# Patient Record
Sex: Female | Born: 1990 | Race: Black or African American | Hispanic: No | Marital: Single | Smoking: Never smoker
Health system: Southern US, Community
[De-identification: ages and names within clinical notes are randomized; demographics above are authoritative.]

## PROBLEM LIST (undated history)

## (undated) ENCOUNTER — Inpatient Hospital Stay (HOSPITAL_COMMUNITY): Payer: Self-pay

## (undated) DIAGNOSIS — Z789 Other specified health status: Secondary | ICD-10-CM

## (undated) DIAGNOSIS — D649 Anemia, unspecified: Secondary | ICD-10-CM

## (undated) HISTORY — DX: Anemia, unspecified: D64.9

## (undated) HISTORY — PX: NO PAST SURGERIES: SHX2092

---

## 2008-05-04 ENCOUNTER — Emergency Department (HOSPITAL_COMMUNITY): Admission: EM | Admit: 2008-05-04 | Discharge: 2008-05-04 | Payer: Self-pay | Admitting: Family Medicine

## 2010-02-11 ENCOUNTER — Ambulatory Visit (HOSPITAL_COMMUNITY): Admission: RE | Admit: 2010-02-11 | Discharge: 2010-02-11 | Payer: Self-pay | Admitting: Obstetrics

## 2010-07-11 ENCOUNTER — Inpatient Hospital Stay (HOSPITAL_COMMUNITY): Admission: AD | Admit: 2010-07-11 | Discharge: 2010-07-13 | Payer: Self-pay | Admitting: Obstetrics & Gynecology

## 2010-12-23 LAB — RPR: RPR Ser Ql: NONREACTIVE

## 2010-12-23 LAB — CBC
Hemoglobin: 12.3 g/dL (ref 12.0–15.0)
MCH: 32.8 pg (ref 26.0–34.0)
MCH: 33.3 pg (ref 26.0–34.0)
MCHC: 34.8 g/dL (ref 30.0–36.0)
MCV: 94.3 fL (ref 78.0–100.0)
RBC: 2.81 MIL/uL — ABNORMAL LOW (ref 3.87–5.11)
RBC: 3.75 MIL/uL — ABNORMAL LOW (ref 3.87–5.11)
RDW: 13.3 % (ref 11.5–15.5)
WBC: 10.6 10*3/uL — ABNORMAL HIGH (ref 4.0–10.5)
WBC: 6.9 10*3/uL (ref 4.0–10.5)

## 2011-11-25 ENCOUNTER — Inpatient Hospital Stay (HOSPITAL_COMMUNITY): Payer: Medicaid Other

## 2011-11-25 ENCOUNTER — Inpatient Hospital Stay (HOSPITAL_COMMUNITY)
Admission: AD | Admit: 2011-11-25 | Discharge: 2011-11-25 | Disposition: A | Discharge status: Home / Self Care | Payer: Medicaid Other | Source: Ambulatory Visit | Attending: Obstetrics & Gynecology | Admitting: Obstetrics & Gynecology

## 2011-11-25 ENCOUNTER — Encounter (HOSPITAL_COMMUNITY): Payer: Self-pay | Admitting: *Deleted

## 2011-11-25 DIAGNOSIS — O034 Incomplete spontaneous abortion without complication: Secondary | ICD-10-CM | POA: Insufficient documentation

## 2011-11-25 HISTORY — DX: Other specified health status: Z78.9

## 2011-11-25 LAB — CBC
HCT: 33.5 % — ABNORMAL LOW (ref 36.0–46.0)
Hemoglobin: 11.2 g/dL — ABNORMAL LOW (ref 12.0–15.0)
MCH: 30 pg (ref 26.0–34.0)
MCHC: 33.4 g/dL (ref 30.0–36.0)
MCV: 89.8 fL (ref 78.0–100.0)
Platelets: 336 10*3/uL (ref 150–400)
RBC: 3.73 MIL/uL — ABNORMAL LOW (ref 3.87–5.11)
RDW: 13 % (ref 11.5–15.5)

## 2011-11-25 LAB — URINALYSIS, ROUTINE W REFLEX MICROSCOPIC
Glucose, UA: NEGATIVE mg/dL
Urobilinogen, UA: 0.2 mg/dL (ref 0.0–1.0)
pH: 6 (ref 5.0–8.0)

## 2011-11-25 LAB — ABO/RH: ABO/RH(D): A POS

## 2011-11-25 LAB — URINE MICROSCOPIC-ADD ON

## 2011-11-25 MED ORDER — IBUPROFEN 600 MG PO TABS: 600.0000 mg | ORAL_TABLET | Freq: Four times a day (QID) | ORAL | Status: AC | PRN

## 2011-11-25 MED ORDER — ACETAMINOPHEN-CODEINE 300-30 MG PO TABS: 1.0000 | ORAL_TABLET | ORAL | Status: AC | PRN

## 2011-11-25 MED ORDER — MISOPROSTOL 200 MCG PO TABS: 800.0000 ug | ORAL_TABLET | Freq: Once | ORAL | Status: DC

## 2011-11-25 NOTE — ED Provider Notes (Signed)
History     Chief Complaint  Patient presents with  . Vaginal Bleeding   HPI  Pt is here with report of spotting of blood that started three days ago.  First started with cramping and spotting of blood.  Cramping has decreased, but spotting has continued.  Pain is in the midpelvic region.  Denies abnormal vaginal discharge or UTI symptoms.    Past Medical History  Diagnosis Date  . No pertinent past medical history     Past Surgical History  Procedure Date  . No past surgeries     Family History  Problem Relation Age of Onset  . Anesthesia problems Neg Hx     History  Substance Use Topics  . Smoking status: Never Smoker   . Smokeless tobacco: Never Used  . Alcohol Use: No    Allergies: No Known Allergies  Prescriptions prior to admission  Medication Sig Dispense Refill  . Prenatal Vit-Fe Fumarate-FA (PRENATAL MULTIVITAMIN) TABS Take 1 tablet by mouth daily.        Review of Systems  Gastrointestinal: Positive for abdominal pain.  Genitourinary:       Spotting of blood  All other systems reviewed and are negative.   Physical Exam   Blood pressure 108/51, pulse 72, temperature 98.2 F (36.8 C), temperature source Oral, resp. rate 18, height 5' 4.5" (1.638 m), last menstrual period 09/24/2011, SpO2 100.00%, unknown if currently breastfeeding.  Physical Exam  Constitutional: She is oriented to person, place, and time. She appears well-developed and well-nourished.  HENT:  Head: Normocephalic.  Neck: Normal range of motion. Neck supple.  Cardiovascular: Normal rate, regular rhythm and normal heart sounds.   Respiratory: Effort normal and breath sounds normal. No respiratory distress.  GI: Soft. She exhibits no mass. There is no tenderness. There is no rebound and no guarding.  Genitourinary: Uterus is enlarged. Cervix exhibits no motion tenderness. Right adnexum displays no mass and no tenderness. Left adnexum displays no mass and no tenderness. There is  bleeding around the vagina.  Neurological: She is alert and oriented to person, place, and time.  Skin: Skin is warm and dry.    MAU Course  Procedures   Results for orders placed during the hospital encounter of 11/25/11 (from the past 24 hour(s))  URINALYSIS, ROUTINE W REFLEX MICROSCOPIC     Status: Abnormal   Collection Time   11/25/11  2:05 PM      Component Value Range   Color, Urine YELLOW  YELLOW    APPearance CLEAR  CLEAR    Specific Gravity, Urine 1.020  1.005 - 1.030    pH 6.0  5.0 - 8.0    Glucose, UA NEGATIVE  NEGATIVE (mg/dL)   Hgb urine dipstick MODERATE (*) NEGATIVE    Bilirubin Urine NEGATIVE  NEGATIVE    Ketones, ur NEGATIVE  NEGATIVE (mg/dL)   Protein, ur NEGATIVE  NEGATIVE (mg/dL)   Urobilinogen, UA 0.2  0.0 - 1.0 (mg/dL)   Nitrite NEGATIVE  NEGATIVE    Leukocytes, UA NEGATIVE  NEGATIVE   URINE MICROSCOPIC-ADD ON     Status: Abnormal   Collection Time   11/25/11  2:05 PM      Component Value Range   Squamous Epithelial / LPF FEW (*) RARE    WBC, UA 0-2  <3 (WBC/hpf)   RBC / HPF 0-2  <3 (RBC/hpf)   Bacteria, UA FEW (*) RARE    Urine-Other MUCOUS PRESENT    POCT PREGNANCY, URINE  Status: Abnormal   Collection Time   11/25/11  2:48 PM      Component Value Range   Preg Test, Ur POSITIVE (*) NEGATIVE   WET PREP, GENITAL     Status: Abnormal   Collection Time   11/25/11  3:00 PM      Component Value Range   Yeast Wet Prep HPF POC NONE SEEN  NONE SEEN    Trich, Wet Prep NONE SEEN  NONE SEEN    Clue Cells Wet Prep HPF POC NONE SEEN  NONE SEEN    WBC, Wet Prep HPF POC FEW (*) NONE SEEN   CBC     Status: Abnormal   Collection Time   11/25/11  3:24 PM      Component Value Range   WBC 6.5  4.0 - 10.5 (K/uL)   RBC 3.73 (*) 3.87 - 5.11 (MIL/uL)   Hemoglobin 11.2 (*) 12.0 - 15.0 (g/dL)   HCT 16.1 (*) 09.6 - 46.0 (%)   MCV 89.8  78.0 - 100.0 (fL)   MCH 30.0  26.0 - 34.0 (pg)   MCHC 33.4  30.0 - 36.0 (g/dL)   RDW 04.5  40.9 - 81.1 (%)   Platelets 336  150 -  400 (K/uL)  HCG, QUANTITATIVE, PREGNANCY     Status: Abnormal   Collection Time   11/25/11  3:24 PM      Component Value Range   hCG, Beta Chain, Quant, S 3500 (*) <5 (mIU/mL)  ABO/RH     Status: Normal   Collection Time   11/25/11  3:24 PM      Component Value Range   ABO/RH(D) A POS     Reviewed Cytotec Protocol; pt meets all criteria and consents to procedure.  Assessment and Plan  Fetal Demise  Plan: RX cytotec Tylenol#3 Ibuprofen F/U in GYN clinic for BHCG only in two weeks  [facility] 11/25/2011, 2:53 PM

## 2011-11-25 NOTE — Discharge Instructions (Signed)
Incomplete Miscarriage Miscarriages in pregnancy are common. A miscarriage is a pregnancy that has ended before the twentieth week. You have had an incomplete miscarriage. Partial parts of the fetus or placenta (afterbirth) remain behind. Sometimes further treatment is needed. The most common reason for further treatment is continued bleeding (hemorrhage). Tissue left behind may also become infected. Treatment usually is curettage. Curettage for an incomplete abortion is a procedure in which the remaining products of pregnancy are removed. This can be done by a simple sucking procedure (suction curettage). It can also be done by a simple scraping (curettage) of the inside of the uterus (womb). This may be done in the hospital or in the caregiver's office. This is only done when your caregiver knows the pregnancy has ended. This is determined by physical examination and a negative pregnancy test. It may also include an ultrasound to confirm a dead fetus. The ultrasound may also prove that products of the pregnancy remain in the uterus. If your cervix remains dilated and you are still passing clots and tissue, your caregiver may wish to watch you for a little while. Your caregiver may want to see if you are going to finish passing all of the remaining parts of the pregnancy. If the bleeding continues, they may proceed with curettage. WHY DO I FEEL THIS WAY Miscarriages can be a very emotional time for prospective mothers. This is not you or your partner's fault. The miscarriage did not occur because of a lack in you or your partner. Nearly all miscarriages occur because the pregnancy has started off wrongly. At least half of miscarried pregnancies have a chromosomal abnormality (almost always not inherited). Others may have developmental problems with the fetus or placentas. Problems may not show up even when the products miscarried are studied under the microscope. You can usually begin trying for another  pregnancy as soon as your caregiver says it's okay. HOME CARE INSTRUCTIONS   Your caregiver may order bed rest (this means only getting up to use the bathroom). Your caregiver may allow you to continue light activity. If curettage was not done at this time, but you require further treatment.   Keep track of the number of pads you use each day. Keep track of how saturated (soaked) they are. Record this information.   Do not use tampons. Do not douche or have sexual intercourse until approved by your caregiver.   It is very important to keep all follow-up appointments for re-evaluation and continuing management.   Women who have an Rh negative blood type (ie, A, B, AB, or O negative) need to receive a drug called Rh(D) immune globulin. This medicine helps protect future fetuses against problems that can occur if an Rh negative mother is carrying a baby who is Rh positive.  SEEK IMMEDIATE MEDICAL CARE IF:   You experience severe cramps in your stomach, back, or abdomen.   You run an unexplained temperature (record these).   You pass large clots or tissue (save any tissue for your caregiver to inspect).   Your bleeding increases or you become light-headed, weak, or have fainting episodes.  MAKE SURE YOU:   Understand these instructions.   Will watch your condition.   Will get help right away if you are not doing well or get worse.  Document Released: 09/26/2005 Document Revised: 06/08/2011 Document Reviewed: 05/16/2008 Exit[employer] Patient Information 2012 [employer]. 

## 2011-11-25 NOTE — Progress Notes (Addendum)
+  preg test about a month ago.  occ stomach discomfort and dk red/brown d/c, started 2 days ago.  Low back and low stomach pain started yesterday.  LMP some time in Dec.

## 2011-11-26 LAB — GC/CHLAMYDIA PROBE AMP, GENITAL
Chlamydia, DNA Probe: NEGATIVE
GC Probe Amp, Genital: NEGATIVE

## 2011-11-27 NOTE — ED Provider Notes (Signed)
Medical Screening exam and patient care preformed by advanced practice provider.  Agree with the above management.  

## 2012-08-08 ENCOUNTER — Emergency Department (INDEPENDENT_AMBULATORY_CARE_PROVIDER_SITE_OTHER)
Admission: EM | Admit: 2012-08-08 | Discharge: 2012-08-08 | Disposition: A | Discharge status: Home / Self Care | Payer: Medicaid Other | Source: Home / Self Care | Attending: Family Medicine | Admitting: Family Medicine

## 2012-08-08 ENCOUNTER — Encounter (HOSPITAL_COMMUNITY): Payer: Self-pay | Admitting: *Deleted

## 2012-08-08 DIAGNOSIS — L309 Dermatitis, unspecified: Secondary | ICD-10-CM

## 2012-08-08 DIAGNOSIS — L259 Unspecified contact dermatitis, unspecified cause: Secondary | ICD-10-CM

## 2012-08-08 MED ORDER — TRIAMCINOLONE ACETONIDE 0.5 % EX OINT: TOPICAL_OINTMENT | Freq: Two times a day (BID) | CUTANEOUS | Status: DC

## 2012-08-08 MED ORDER — HYDROXYZINE HCL 25 MG PO TABS: 25.0000 mg | ORAL_TABLET | Freq: Three times a day (TID) | ORAL | Status: DC | PRN

## 2012-08-08 MED ORDER — CEPHALEXIN 500 MG PO CAPS: 500.0000 mg | ORAL_CAPSULE | Freq: Two times a day (BID) | ORAL | Status: DC

## 2012-08-08 NOTE — ED Notes (Signed)
Pt  Reports   She    Has     A     Dry  scaley   Area     To  r      Ring  Finger  Which  She      Has   Had   Had    For  About  3 months        The  Pt    Reports       The  Symptoms  Are  Getting  Worse              She  Reports  She  May  Have   Had  clorox   On the  Finger   Initially          She  Is  Sitting  Upright on  Exam table speaking in  Complete  sentances  And  Appears  In no  Acute  Distress

## 2012-08-11 NOTE — ED Provider Notes (Signed)
History     CSN: [id number]  Arrival date & time 08/08/12  1153   First MD Initiated Contact with Patient 08/08/12 1240      Chief Complaint  Patient presents with  . Hand Problem    (Consider location/radiation/quality/duration/timing/severity/associated sxs/prior treatment) HPI Comments: 21 y/o right handed female here c/o of dry itch skin in right middle finger for 3 months getting worse now with brakes, swelling and burning. States her symptoms started after direct exposure to Clorox in affected area. No other body areas affected. No fever or chills. No wheezing or h/o asthma.    Past Medical History  Diagnosis Date  . No pertinent past medical history     Past Surgical History  Procedure Date  . No past surgeries     Family History  Problem Relation Age of Onset  . Anesthesia problems Neg Hx     History  Substance Use Topics  . Smoking status: Never Smoker   . Smokeless tobacco: Never Used  . Alcohol Use: No    OB History    Grav Para Term Preterm Abortions TAB SAB Ect Mult Living   2 1 1       1       Review of Systems  Constitutional: Negative for fever and chills.  HENT: Negative for sneezing.   Respiratory: Negative for wheezing.   Musculoskeletal: Negative for joint swelling and arthralgias.  Skin:       As per HPI  All other systems reviewed and are negative.    Allergies  Review of patient's allergies indicates no known allergies.  Home Medications   Current Outpatient Rx  Name Route Sig Dispense Refill  . CEPHALEXIN 500 MG PO CAPS Oral Take 1 capsule (500 mg total) by mouth 2 (two) times daily. 14 capsule 0  . HYDROXYZINE HCL 25 MG PO TABS Oral Take 1 tablet (25 mg total) by mouth every 8 (eight) hours as needed for itching. 12 tablet 0  . MISOPROSTOL 200 MCG PO TABS Oral Take 4 tablets (800 mcg total) by mouth once. Insert in vagina at once; do not take orally. 4 tablet 0  . PRENATAL MULTIVITAMIN CH Oral Take 1 tablet by mouth daily.      . TRIAMCINOLONE ACETONIDE 0.5 % EX OINT Topical Apply topically 2 (two) times daily. 30 g 0    BP 98/61  Pulse 62  Temp 99.3 F (37.4 C) (Oral)  Resp 16  SpO2 100%  LMP 07/12/2012  Breastfeeding? Unknown  Physical Exam  Nursing note and vitals reviewed. Constitutional: She is oriented to person, place, and time.  Cardiovascular: Normal heart sounds.   Pulmonary/Chest: Breath sounds normal.  Neurological: She is alert and oriented to person, place, and time.  Skin:       Right hand: skin thickening hyperpigmentation and peeling of right middle finger mostly dorsal but also affecting palmar side. Few skin brakes with minimal amber drainage and crusts. Area around and nail appears speared. Patient has otherwise full range of motion and superficial sensation in the affected finger.   Rest of hand exam is normal. Rest of body skin is clear.     ED Course  Procedures (including critical care time)  Labs Reviewed - No data to display No results found.   1. Dermatitis       MDM  Eczema vs lichen planus. Appears over infected. Prescribed keflex, hydroxyzine and reccommended occlusive bandage and treatment with topical kenalog 0.5% ointment. Follow up with dermatology as  needed.       Sharin Grave, MD 08/11/12 1043

## 2014-08-11 ENCOUNTER — Encounter (HOSPITAL_COMMUNITY): Payer: Self-pay | Admitting: *Deleted

## 2014-10-10 NOTE — L&D Delivery Note (Cosign Needed)
Delivery Note  This is a 24 year old G 3 P1 who was admitted for Not in labor. and IOL for post dates. She progressed normally with cytotec, pitocin and 1 dose of IVP Fentanyl to the second stage of labor.  She pushed for 10 min. A loose nuchal cord  was identified.  At 8:02 PM a viable female was delivered via Vaginal, Spontaneous Delivery (Presentation: ; Occiput Anterior).  APGAR: 8, 9; weight  .  Infant was placed on maternal abdomen.  Delayed cord clamping for several minutes.  Cord double clamped and cut.  Placenta status: Intact, Spontaneous, shultz,with trailing membranes and multiple placental cysts.  Cord: 3 vessels with the following complications: None.  Cord pH: N/A.  Inspection revealed 1st degree and vaginal. The uterus was firm bleeding stable.  The repair was done under local lidocaine.   EBL was 420.    There were no complications during the procedure.  Mom and baby skin to skin following delivery. Left in stable condition.  Anesthesia: None  Episiotomy: None Lacerations: 1st degree Suture Repair: 3.0 vicryl Est. Blood Loss (mL):  48  Mom to postpartum.  Baby to Couplet care / Skin to Skin.  Kemar Pandit A Zinedine Ellner 06/16/2015, 8:40 PM

## 2014-11-10 ENCOUNTER — Telehealth: Payer: Self-pay | Admitting: *Deleted

## 2014-11-10 NOTE — Telephone Encounter (Signed)
Patient interested in scheduling a NOB appointment. LMP 09-03-14. Patient verfied pregnancy at [facility].  Attempted to contact patient and left message for patient to contact the office.

## 2014-11-13 NOTE — Telephone Encounter (Signed)
Patient is scheduled for a NOB appointment on 11-14-14 @ 2 pm with Kandis Cocking, CNM.

## 2014-11-14 ENCOUNTER — Encounter: Payer: Self-pay | Admitting: Certified Nurse Midwife

## 2014-11-14 ENCOUNTER — Other Ambulatory Visit: Payer: Self-pay | Admitting: Certified Nurse Midwife

## 2014-11-14 ENCOUNTER — Ambulatory Visit (INDEPENDENT_AMBULATORY_CARE_PROVIDER_SITE_OTHER): Payer: Medicaid Other | Admitting: Certified Nurse Midwife

## 2014-11-14 VITALS — BP 125/81 | HR 89 | Temp 98.6°F | Wt 121.0 lb

## 2014-11-14 DIAGNOSIS — Z3481 Encounter for supervision of other normal pregnancy, first trimester: Secondary | ICD-10-CM

## 2014-11-14 DIAGNOSIS — Z3689 Encounter for other specified antenatal screening: Secondary | ICD-10-CM

## 2014-11-14 LAB — POCT URINALYSIS DIPSTICK
BILIRUBIN UA: NEGATIVE
Glucose, UA: NEGATIVE
Ketones, UA: NEGATIVE
Nitrite, UA: NEGATIVE
PROTEIN UA: NEGATIVE
RBC UA: NEGATIVE
SPEC GRAV UA: 1.01
Urobilinogen, UA: NEGATIVE
pH, UA: 7

## 2014-11-14 LAB — OB RESULTS CONSOLE GC/CHLAMYDIA
Chlamydia: NEGATIVE
Gonorrhea: NEGATIVE

## 2014-11-14 LAB — HIV ANTIBODY (ROUTINE TESTING W REFLEX): HIV 1&2 Ab, 4th Generation: NONREACTIVE

## 2014-11-14 NOTE — Progress Notes (Signed)
Subjective:    Erin Torres is being seen today for her first obstetrical visit.  This is not a planned pregnancy. She is at [redacted]w[redacted]d gestation. Her obstetrical history is non-significant for complications, had a 26 yr old son vaginally no complications that was breastfed. Relationship with FOB: significant other, not living together. Patient does intend to breast feed. Pregnancy history fully reviewed.  The information documented in the HPI was reviewed and verified.  Menstrual History: OB History    Gravida Para Term Preterm AB TAB SAB Ectopic Multiple Living   3 1 1  1  1   1       Menarche age: 28  Patient's last menstrual period was 08/31/2014 (lmp unknown).    Past Medical History  Diagnosis Date  . No pertinent past medical history     Past Surgical History  Procedure Laterality Date  . No past surgeries       (Not in a hospital admission) No Known Allergies  History  Substance Use Topics  . Smoking status: Never Smoker   . Smokeless tobacco: Never Used  . Alcohol Use: No    Family History  Problem Relation Age of Onset  . Anesthesia problems Neg Hx      Review of Systems Constitutional: negative for weight loss Gastrointestinal: negative for vomiting Genitourinary:negative for genital lesions and vaginal discharge and dysuria Musculoskeletal:negative for back pain Behavioral/Psych: negative for abusive relationship, depression, illegal drug usage and tobacco use    Objective:    BP 125/81 mmHg  Pulse 89  Temp(Src) 98.6 F (37 C)  Wt 54.885 kg (121 lb)  LMP 08/31/2014 (LMP Unknown) General Appearance:    Alert, cooperative, no distress, appears stated age  Head:    Normocephalic, without obvious abnormality, atraumatic  Eyes:    PERRL, conjunctiva/corneas clear, EOM's intact, fundi    benign, both eyes  Ears:    Normal TM's and external ear canals, both ears  Nose:   Nares normal, septum midline, mucosa normal, no drainage    or sinus tenderness   Throat:   Lips, mucosa, and tongue normal; teeth and gums normal  Neck:   Supple, symmetrical, trachea midline, no adenopathy;    thyroid:  no enlargement/tenderness/nodules; no carotid   bruit or JVD  Back:     Symmetric, no curvature, ROM normal, no CVA tenderness  Lungs:     Clear to auscultation bilaterally, respirations unlabored  Chest Wall:    No tenderness or deformity   Heart:    Regular rate and rhythm, S1 and S2 normal, no murmur, rub   or gallop  Breast Exam:    No tenderness, masses, or nipple abnormality  Abdomen:     Soft, non-tender, bowel sounds active all four quadrants,    no masses, no organomegaly  Genitalia:    Normal female without lesion, discharge or tenderness  Extremities:   Extremities normal, atraumatic, no cyanosis or edema  Pulses:   2+ and symmetric all extremities  Skin:   Skin color, texture, turgor normal, no rashes or lesions  Lymph nodes:   Cervical, supraclavicular, and axillary nodes normal  Neurologic:   CNII-XII intact, normal strength, sensation and reflexes    throughout      Lab Review Urine pregnancy test Labs reviewed yes Radiologic studies reviewed no Assessment:    Pregnancy at [redacted]w[redacted]d weeks    Plan:      Prenatal vitamins.  Counseling provided regarding continued use of seat belts, cessation of alcohol consumption,  smoking or use of illicit drugs; infection precautions i.e., influenza/TDAP immunizations, toxoplasmosis,CMV, parvovirus, listeria and varicella; workplace safety, exercise during pregnancy; routine dental care, safe medications, sexual activity, hot tubs, saunas, pools, travel, caffeine use, fish and methlymercury, potential toxins, hair treatments, varicose veins.  Counseling on ways to manage nausea given.   Weight gain recommendations per IOM guidelines reviewed: underweight/BMI< 18.5--> gain 28 - 40 lbs; normal weight/BMI 18.5 - 24.9--> gain 25 - 35 lbs; overweight/BMI 25 - 29.9--> gain 15 - 25 lbs; obese/BMI >30->gain   11 - 20 lbs Problem list reviewed and updated. FIRST/CF mutation testing/NIPT/QUAD SCREEN/fragile X/Ashkenazi Jewish population testing/Spinal muscular atrophy discussed: declined. Role of ultrasound in pregnancy discussed; fetal survey: requested. Amniocentesis discussed: not indicated.  No orders of the defined types were placed in this encounter.   Orders Placed This Encounter  Procedures  . Culture, OB Urine  . SureSwab, Vaginosis/Vaginitis Plus  . US OB Transvaginal    Standing Status: Future     Number of Occurrences:      Standing Expiration Date: 01/13/2016    Order Specific Question:  Reason for Exam (SYMPTOM  OR DIAGNOSIS REQUIRED)    Answer:  Dating    Order Specific Question:  Preferred imaging location?    Answer:  Internal  . Obstetric panel  . HIV antibody  . Hemoglobinopathy evaluation  . Varicella zoster antibody, IgG  . Vit D  25 hydroxy (rtn osteoporosis monitoring)  . POCT urinalysis dipstick    Follow up in 4 weeks.

## 2014-11-15 LAB — VITAMIN D 25 HYDROXY (VIT D DEFICIENCY, FRACTURES): Vit D, 25-Hydroxy: 9 ng/mL — ABNORMAL LOW (ref 30–100)

## 2014-11-16 LAB — CULTURE, OB URINE
COLONY COUNT: NO GROWTH
ORGANISM ID, BACTERIA: NO GROWTH

## 2014-11-17 LAB — OBSTETRIC PANEL
ANTIBODY SCREEN: NEGATIVE
BASOS ABS: 0 10*3/uL (ref 0.0–0.1)
BASOS PCT: 0 % (ref 0–1)
Eosinophils Absolute: 0.1 10*3/uL (ref 0.0–0.7)
Eosinophils Relative: 1 % (ref 0–5)
HEMATOCRIT: 32.4 % — AB (ref 36.0–46.0)
HEP B S AG: NEGATIVE
Hemoglobin: 10.9 g/dL — ABNORMAL LOW (ref 12.0–15.0)
LYMPHS PCT: 32 % (ref 12–46)
Lymphs Abs: 2.7 10*3/uL (ref 0.7–4.0)
MCH: 30.3 pg (ref 26.0–34.0)
MCHC: 33.6 g/dL (ref 30.0–36.0)
MCV: 90 fL (ref 78.0–100.0)
MONO ABS: 0.7 10*3/uL (ref 0.1–1.0)
MONOS PCT: 9 % (ref 3–12)
MPV: 9.2 fL (ref 8.6–12.4)
NEUTROS ABS: 4.8 10*3/uL (ref 1.7–7.7)
NEUTROS PCT: 58 % (ref 43–77)
PLATELETS: 420 10*3/uL — AB (ref 150–400)
RBC: 3.6 MIL/uL — AB (ref 3.87–5.11)
RDW: 13.1 % (ref 11.5–15.5)
RUBELLA: 1.8 {index} — AB (ref <0.90)
Rh Type: POSITIVE
WBC: 8.3 10*3/uL (ref 4.0–10.5)

## 2014-11-17 LAB — VARICELLA ZOSTER ANTIBODY, IGG: Varicella IgG: 274.3 Index — ABNORMAL HIGH (ref <135.00)

## 2014-11-18 LAB — SURESWAB, VAGINOSIS/VAGINITIS PLUS
Atopobium vaginae: NOT DETECTED Log (cells/mL)
C. GLABRATA, DNA: NOT DETECTED
C. PARAPSILOSIS, DNA: NOT DETECTED
C. TROPICALIS, DNA: NOT DETECTED
C. albicans, DNA: DETECTED — AB
C. trachomatis RNA, TMA: NOT DETECTED
Gardnerella vaginalis: NOT DETECTED Log (cells/mL)
LACTOBACILLUS SPECIES: NOT DETECTED Log (cells/mL)
MEGASPHAERA SPECIES: NOT DETECTED Log (cells/mL)
N. GONORRHOEAE RNA, TMA: NOT DETECTED
T. vaginalis RNA, QL TMA: NOT DETECTED

## 2014-11-18 LAB — HEMOGLOBINOPATHY EVALUATION
HEMOGLOBIN OTHER: 0 %
HGB A2 QUANT: 2.3 % (ref 2.2–3.2)
Hgb A: 97.7 % (ref 96.8–97.8)
Hgb F Quant: 0 % (ref 0.0–2.0)
Hgb S Quant: 0 %

## 2014-11-19 ENCOUNTER — Ambulatory Visit (INDEPENDENT_AMBULATORY_CARE_PROVIDER_SITE_OTHER): Payer: Medicaid Other

## 2014-11-19 ENCOUNTER — Other Ambulatory Visit: Payer: Self-pay | Admitting: Certified Nurse Midwife

## 2014-11-19 DIAGNOSIS — Z3689 Encounter for other specified antenatal screening: Secondary | ICD-10-CM

## 2014-11-19 DIAGNOSIS — Z36 Encounter for antenatal screening of mother: Secondary | ICD-10-CM

## 2014-11-19 LAB — US OB COMP LESS 14 WKS

## 2014-11-26 ENCOUNTER — Encounter: Payer: Self-pay | Admitting: *Deleted

## 2014-11-26 ENCOUNTER — Other Ambulatory Visit: Payer: Self-pay | Admitting: *Deleted

## 2014-11-26 DIAGNOSIS — Z3492 Encounter for supervision of normal pregnancy, unspecified, second trimester: Secondary | ICD-10-CM

## 2014-11-26 DIAGNOSIS — B379 Candidiasis, unspecified: Secondary | ICD-10-CM

## 2014-11-26 MED ORDER — TERCONAZOLE 0.4 % VA CREA: 1.0000 | TOPICAL_CREAM | Freq: Every day | VAGINAL | Status: DC

## 2014-11-26 MED ORDER — OB COMPLETE PETITE 35-5-1-200 MG PO CAPS: 1.0000 | ORAL_CAPSULE | Freq: Every day | ORAL | Status: DC

## 2014-11-26 NOTE — Progress Notes (Signed)
PNV sent to pharmacy for decreased vitamin D.  Letter to be sent to pt.

## 2014-11-26 NOTE — Progress Notes (Signed)
terazol 7 sent to pharmacy for yeast inf.

## 2014-12-04 ENCOUNTER — Encounter: Payer: Self-pay | Admitting: Obstetrics

## 2014-12-11 ENCOUNTER — Encounter: Payer: Self-pay | Admitting: Obstetrics

## 2014-12-12 ENCOUNTER — Encounter: Payer: Medicaid Other | Admitting: Certified Nurse Midwife

## 2014-12-17 ENCOUNTER — Ambulatory Visit (INDEPENDENT_AMBULATORY_CARE_PROVIDER_SITE_OTHER): Payer: Medicaid Other | Admitting: Certified Nurse Midwife

## 2014-12-17 ENCOUNTER — Encounter: Payer: Medicaid Other | Admitting: Certified Nurse Midwife

## 2014-12-17 VITALS — BP 110/63 | HR 90 | Wt 121.0 lb

## 2014-12-17 DIAGNOSIS — Z3482 Encounter for supervision of other normal pregnancy, second trimester: Secondary | ICD-10-CM

## 2014-12-17 DIAGNOSIS — Z8669 Personal history of other diseases of the nervous system and sense organs: Secondary | ICD-10-CM | POA: Insufficient documentation

## 2014-12-17 DIAGNOSIS — O99012 Anemia complicating pregnancy, second trimester: Secondary | ICD-10-CM

## 2014-12-17 LAB — POCT URINALYSIS DIPSTICK
Bilirubin, UA: NEGATIVE
Glucose, UA: NEGATIVE
Ketones, UA: NEGATIVE
Leukocytes, UA: NEGATIVE
NITRITE UA: NEGATIVE
PH UA: 6.5
Protein, UA: NEGATIVE
RBC UA: NEGATIVE
SPEC GRAV UA: 1.015
Urobilinogen, UA: NEGATIVE

## 2014-12-17 LAB — HEMOGLOBIN AND HEMATOCRIT, BLOOD
HCT: 31.3 % — ABNORMAL LOW (ref 36.0–46.0)
HEMOGLOBIN: 10.7 g/dL — AB (ref 12.0–15.0)

## 2014-12-17 MED ORDER — BUTALBITAL-APAP-CAFFEINE 50-325-40 MG PO TABS: 1.0000 | ORAL_TABLET | Freq: Four times a day (QID) | ORAL | Status: AC | PRN

## 2014-12-17 NOTE — Progress Notes (Signed)
  Subjective:    Erin Torres is a 24 y.o. female being seen today for her obstetrical visit. She is at [redacted]w[redacted]d gestation. Patient reports: headache and no contractions, no bleeding or leaking of fluid.  HA somewhat relieved with OTC tylenol.  Reports low back ache, non-radiating, denies pelvic pressure.    Problem List Items Addressed This Visit    None    Visit Diagnoses    Encounter for supervision of other normal pregnancy in second trimester    -  Primary    Relevant Orders    POCT urinalysis dipstick    US OB Comp + 14 Wk    Anemia affecting pregnancy in second trimester, antepartum        Relevant Orders    Hemoglobin and hematocrit, blood    Hx of migraines          There are no active problems to display for this patient.   Objective:     BP 110/63 mmHg  Pulse 90  Wt 54.885 kg (121 lb)  LMP 08/31/2014 (LMP Unknown) Uterine Size: Below umbilicus     Assessment:    Pregnancy @ [redacted]w[redacted]d  weeks Doing well Migraines    Plan:    Problem list reviewed and updated. Labs reviewed. F/U H&H drawn today.  Fioricet given for Migraines.  Follow up in 4 weeks after fetal anatomy scan. FIRST/CF mutation testing/NIPT/QUAD SCREEN/fragile X/Ashkenazi Jewish population testing/Spinal muscular atrophy discussed: declined. Role of ultrasound in pregnancy discussed; fetal survey: requested. Amniocentesis discussed: not indicated.

## 2014-12-30 ENCOUNTER — Other Ambulatory Visit: Payer: Self-pay | Admitting: Certified Nurse Midwife

## 2014-12-30 DIAGNOSIS — Z1389 Encounter for screening for other disorder: Secondary | ICD-10-CM

## 2014-12-31 ENCOUNTER — Telehealth: Payer: Self-pay

## 2014-12-31 NOTE — Telephone Encounter (Signed)
changed patient's u/s to 4/6 as she will be further along closer to 20 weeks as is anatomy scan - ok per patient and Pam

## 2015-01-07 ENCOUNTER — Other Ambulatory Visit: Payer: Medicaid Other

## 2015-01-14 ENCOUNTER — Ambulatory Visit (INDEPENDENT_AMBULATORY_CARE_PROVIDER_SITE_OTHER): Payer: Medicaid Other

## 2015-01-14 ENCOUNTER — Ambulatory Visit (INDEPENDENT_AMBULATORY_CARE_PROVIDER_SITE_OTHER): Payer: Medicaid Other | Admitting: Certified Nurse Midwife

## 2015-01-14 VITALS — BP 106/58 | HR 87 | Temp 98.2°F | Wt 124.0 lb

## 2015-01-14 DIAGNOSIS — Z3482 Encounter for supervision of other normal pregnancy, second trimester: Secondary | ICD-10-CM | POA: Diagnosis not present

## 2015-01-14 DIAGNOSIS — Z36 Encounter for antenatal screening of mother: Secondary | ICD-10-CM

## 2015-01-14 DIAGNOSIS — Z1389 Encounter for screening for other disorder: Secondary | ICD-10-CM

## 2015-01-14 DIAGNOSIS — L309 Dermatitis, unspecified: Secondary | ICD-10-CM

## 2015-01-14 LAB — POCT URINALYSIS DIPSTICK
Bilirubin, UA: NEGATIVE
Glucose, UA: NEGATIVE
Ketones, UA: NEGATIVE
Leukocytes, UA: NEGATIVE
Nitrite, UA: NEGATIVE
PROTEIN UA: NEGATIVE
RBC UA: NEGATIVE
UROBILINOGEN UA: NEGATIVE

## 2015-01-14 LAB — US OB COMP + 14 WK

## 2015-01-14 MED ORDER — PIMECROLIMUS 1 % EX CREA: TOPICAL_CREAM | CUTANEOUS | Status: DC

## 2015-01-14 MED ORDER — HYDROXYZINE PAMOATE 25 MG PO CAPS: 25.0000 mg | ORAL_CAPSULE | Freq: Three times a day (TID) | ORAL | Status: DC | PRN

## 2015-01-14 NOTE — Progress Notes (Signed)
Subjective:    Erin Torres is a 24 y.o. female being seen today for her obstetrical visit. She is at [redacted]w[redacted]d gestation. Patient reports: backache, no bleeding, no contractions, no cramping and no leaking . Fetal movement: normal.  She is interested in water birth, information given.  Discussed non pharmacological treatments for the low back pain: heat, piliates.  C/O eczema on lower extremities with puritus at night.  Had ultrasound today before exam, fetus is a female.    Problem List Items Addressed This Visit    None    Visit Diagnoses    Encounter for supervision of other normal pregnancy in second trimester    -  Primary    Relevant Orders    POCT urinalysis dipstick (Completed)    Eczema        Relevant Medications    pimecrolimus (ELIDEL) cream 1%    hydrOXYzine (BH ONLY - VISTARIL) capsule      Patient Active Problem List   Diagnosis Date Noted  . Hx of migraines 12/17/2014   Objective:    BP 106/58 mmHg  Pulse 87  Temp(Src) 98.2 F (36.8 C)  Wt 56.246 kg (124 lb)  LMP 08/31/2014 (LMP Unknown) FHT: 130's BPM  Uterine Size: size equals dates and fundus at U.     Assessment:    Pregnancy @ [redacted]w[redacted]d    Plan:    OBGCT: discussed. Signs and symptoms of preterm labor: discussed. Labs, problem list reviewed and updated 2 hr GTT planned Follow up in 4 weeks.

## 2015-01-15 ENCOUNTER — Encounter: Payer: Medicaid Other | Admitting: Certified Nurse Midwife

## 2015-02-11 ENCOUNTER — Ambulatory Visit (INDEPENDENT_AMBULATORY_CARE_PROVIDER_SITE_OTHER): Payer: Medicaid Other | Admitting: Obstetrics

## 2015-02-11 VITALS — BP 99/58 | HR 74 | Temp 98.4°F | Wt 127.0 lb

## 2015-02-11 DIAGNOSIS — Z3482 Encounter for supervision of other normal pregnancy, second trimester: Secondary | ICD-10-CM

## 2015-02-11 LAB — POCT URINALYSIS DIPSTICK
Bilirubin, UA: NEGATIVE
Glucose, UA: NEGATIVE
Ketones, UA: NEGATIVE
Leukocytes, UA: NEGATIVE
Nitrite, UA: NEGATIVE
PH UA: 7
PROTEIN UA: NEGATIVE
RBC UA: NEGATIVE
Spec Grav, UA: 1.015
UROBILINOGEN UA: NEGATIVE

## 2015-02-12 ENCOUNTER — Encounter: Payer: Self-pay | Admitting: Obstetrics

## 2015-02-12 NOTE — Progress Notes (Signed)
Subjective:    Erin Torres is a 24 y.o. female being seen today for her obstetrical visit. She is at [redacted]w[redacted]d gestation. Patient reports: no complaints . Fetal movement: normal.  Problem List Items Addressed This Visit    None    Visit Diagnoses    Encounter for supervision of other normal pregnancy in second trimester    -  Primary    Relevant Orders    POCT urinalysis dipstick (Completed)      Patient Active Problem List   Diagnosis Date Noted  . Hx of migraines 12/17/2014   Objective:    BP 99/58 mmHg  Pulse 74  Temp(Src) 98.4 F (36.9 C)  Wt 127 lb (57.607 kg)  LMP 08/31/2014 (LMP Unknown) FHT: 150 BPM  Uterine Size: size equals dates     Assessment:    Pregnancy @ [redacted]w[redacted]d    Plan:    OBGCT: ordered.  Labs, problem list reviewed and updated 2 hr GTT planned Follow up in 4 weeks.

## 2015-03-11 ENCOUNTER — Encounter: Payer: Medicaid Other | Admitting: Obstetrics

## 2015-03-11 ENCOUNTER — Telehealth: Payer: Self-pay | Admitting: Obstetrics

## 2015-03-11 ENCOUNTER — Other Ambulatory Visit: Payer: Medicaid Other

## 2015-03-11 NOTE — Telephone Encounter (Signed)
[phone] - Patient called and rescheduled appt. brm

## 2015-03-16 ENCOUNTER — Other Ambulatory Visit: Payer: Medicaid Other

## 2015-03-16 ENCOUNTER — Telehealth: Payer: Self-pay | Admitting: *Deleted

## 2015-03-16 ENCOUNTER — Encounter: Payer: Medicaid Other | Admitting: Obstetrics

## 2015-03-16 NOTE — Telephone Encounter (Signed)
Erin Torres has missed her second appointment in a row. I called to reschedule her appointment and the call went straight to voicemail. I left her a message to call the office as soon as she could to reschedule her appointment.

## 2015-03-19 ENCOUNTER — Ambulatory Visit (INDEPENDENT_AMBULATORY_CARE_PROVIDER_SITE_OTHER): Payer: Medicaid Other | Admitting: Obstetrics

## 2015-03-19 ENCOUNTER — Encounter: Payer: Self-pay | Admitting: Obstetrics

## 2015-03-19 ENCOUNTER — Other Ambulatory Visit: Payer: Medicaid Other

## 2015-03-19 VITALS — BP 102/57 | HR 91 | Temp 98.3°F | Wt 134.0 lb

## 2015-03-19 DIAGNOSIS — Z3483 Encounter for supervision of other normal pregnancy, third trimester: Secondary | ICD-10-CM | POA: Diagnosis not present

## 2015-03-19 LAB — CBC
HCT: 29.5 % — ABNORMAL LOW (ref 36.0–46.0)
Hemoglobin: 10.1 g/dL — ABNORMAL LOW (ref 12.0–15.0)
MCH: 30.8 pg (ref 26.0–34.0)
MCHC: 34.2 g/dL (ref 30.0–36.0)
MCV: 89.9 fL (ref 78.0–100.0)
MPV: 9.5 fL (ref 8.6–12.4)
Platelets: 304 10*3/uL (ref 150–400)
RBC: 3.28 MIL/uL — ABNORMAL LOW (ref 3.87–5.11)
RDW: 13.5 % (ref 11.5–15.5)
WBC: 6.8 10*3/uL (ref 4.0–10.5)

## 2015-03-19 LAB — POCT URINALYSIS DIPSTICK
Bilirubin, UA: NEGATIVE
Blood, UA: NEGATIVE
Glucose, UA: NEGATIVE
KETONES UA: NEGATIVE
Leukocytes, UA: NEGATIVE
NITRITE UA: NEGATIVE
PH UA: 8
Protein, UA: NEGATIVE
Spec Grav, UA: 1.01
Urobilinogen, UA: NEGATIVE

## 2015-03-19 NOTE — Progress Notes (Signed)
Subjective:    Erin Torres is a 24 y.o. female being seen today for her obstetrical visit. She is at [redacted]w[redacted]d gestation. Patient reports no complaints. Fetal movement: normal.  Problem List Items Addressed This Visit    None    Visit Diagnoses    Encounter for supervision of other normal pregnancy in second trimester    -  Primary    Relevant Orders    POCT urinalysis dipstick (Completed)    Glucose Tolerance, 2 Hours w/1 Hour    CBC    HIV antibody    RPR      Patient Active Problem List   Diagnosis Date Noted  . Hx of migraines 12/17/2014   Objective:    BP 102/57 mmHg  Pulse 91  Temp(Src) 98.3 F (36.8 C)  Wt 134 lb (60.782 kg)  LMP 08/31/2014 (LMP Unknown) FHT:  150 BPM  Uterine Size: size equals dates  Presentation: unsure     Assessment:    Pregnancy @ [redacted]w[redacted]d weeks   Plan:     labs reviewed, problem list updated Consent signed. GBS sent TDAP offered  Rhogam given for RH negative Pediatrician: discussed. Infant feeding: plans to breastfeed. Maternity leave: discussed. Cigarette smoking: never smoked. Orders Placed This Encounter  Procedures  . Glucose Tolerance, 2 Hours w/1 Hour  . CBC  . HIV antibody  . RPR  . POCT urinalysis dipstick   No orders of the defined types were placed in this encounter.   Follow up in 2 Weeks.

## 2015-03-20 LAB — RPR

## 2015-03-20 LAB — HIV ANTIBODY (ROUTINE TESTING W REFLEX): HIV 1&2 Ab, 4th Generation: NONREACTIVE

## 2015-03-20 LAB — GLUCOSE TOLERANCE, 2 HOURS W/ 1HR
GLUCOSE, 2 HOUR: 93 mg/dL (ref 70–139)
Glucose, 1 hour: 94 mg/dL (ref 70–170)
Glucose, Fasting: 73 mg/dL (ref 70–99)

## 2015-04-02 ENCOUNTER — Encounter: Payer: Medicaid Other | Admitting: Obstetrics

## 2015-04-08 ENCOUNTER — Other Ambulatory Visit: Payer: Self-pay | Admitting: Certified Nurse Midwife

## 2015-04-09 ENCOUNTER — Ambulatory Visit (INDEPENDENT_AMBULATORY_CARE_PROVIDER_SITE_OTHER): Payer: Medicaid Other | Admitting: Obstetrics

## 2015-04-09 ENCOUNTER — Encounter: Payer: Self-pay | Admitting: Obstetrics

## 2015-04-09 VITALS — BP 100/58 | HR 88 | Temp 97.8°F | Wt 134.0 lb

## 2015-04-09 DIAGNOSIS — Z3483 Encounter for supervision of other normal pregnancy, third trimester: Secondary | ICD-10-CM

## 2015-04-09 NOTE — Progress Notes (Signed)
Subjective:    Erin Torres is a 24 y.o. female being seen today for her obstetrical visit. She is at [redacted]w[redacted]d gestation. Patient reports no complaints. Fetal movement: normal.  Problem List Items Addressed This Visit    None    Visit Diagnoses    Encounter for supervision of other normal pregnancy in third trimester    -  Primary    Relevant Orders    POCT urinalysis dipstick      Patient Active Problem List   Diagnosis Date Noted  . Hx of migraines 12/17/2014   Objective:    BP 100/58 mmHg  Pulse 88  Temp(Src) 97.8 F (36.6 C)  Wt 134 lb (60.782 kg)  LMP 08/31/2014 (LMP Unknown) FHT:  150 BPM  Uterine Size: size equals dates  Presentation: unsure     Assessment:    Pregnancy @ [redacted]w[redacted]d weeks   Plan:     labs reviewed, problem list updated Consent signed. GBS sent TDAP offered  Rhogam given for RH negative Pediatrician: discussed. Infant feeding: plans to breastfeed. Maternity leave: discussed. Cigarette smoking: never smoked. Orders Placed This Encounter  Procedures  . POCT urinalysis dipstick   No orders of the defined types were placed in this encounter.   Follow up in 2 Weeks.

## 2015-04-23 ENCOUNTER — Ambulatory Visit (INDEPENDENT_AMBULATORY_CARE_PROVIDER_SITE_OTHER): Payer: Medicaid Other | Admitting: Obstetrics

## 2015-04-23 ENCOUNTER — Encounter: Payer: Self-pay | Admitting: Obstetrics

## 2015-04-23 VITALS — BP 111/65 | HR 91 | Temp 98.2°F | Wt 137.0 lb

## 2015-04-23 DIAGNOSIS — Z3483 Encounter for supervision of other normal pregnancy, third trimester: Secondary | ICD-10-CM

## 2015-04-23 LAB — POCT URINALYSIS DIPSTICK
Bilirubin, UA: NEGATIVE
GLUCOSE UA: NEGATIVE
Ketones, UA: NEGATIVE
LEUKOCYTES UA: NEGATIVE
Nitrite, UA: NEGATIVE
PH UA: 8
Protein, UA: NEGATIVE
RBC UA: NEGATIVE
Urobilinogen, UA: NEGATIVE

## 2015-04-23 NOTE — Progress Notes (Signed)
Subjective:    Erin Torres is a 24 y.o. female being seen today for her obstetrical visit. She is at [redacted]w[redacted]d gestation. Patient reports no complaints. Fetal movement: normal.  Problem List Items Addressed This Visit    None    Visit Diagnoses    Encounter for supervision of other normal pregnancy in third trimester    -  Primary    Relevant Orders    POCT urinalysis dipstick      Patient Active Problem List   Diagnosis Date Noted  . Hx of migraines 12/17/2014   Objective:    BP 111/65 mmHg  Pulse 91  Temp(Src) 98.2 F (36.8 C)  Wt 137 lb (62.143 kg)  LMP 08/31/2014 (LMP Unknown) FHT:  150 BPM  Uterine Size: size equals dates  Presentation: unsure     Assessment:    Pregnancy @ [redacted]w[redacted]d weeks   Plan:     labs reviewed, problem list updated Consent signed. GBS sent TDAP offered  Rhogam given for RH negative Pediatrician: discussed. Infant feeding: plans to breastfeed. Maternity leave: discussed. Cigarette smoking: never smoked. Orders Placed This Encounter  Procedures  . POCT urinalysis dipstick   No orders of the defined types were placed in this encounter.   Follow up in 2 Weeks.

## 2015-05-07 ENCOUNTER — Encounter: Payer: Medicaid Other | Admitting: Obstetrics

## 2015-05-14 ENCOUNTER — Encounter: Payer: Self-pay | Admitting: Obstetrics

## 2015-05-14 ENCOUNTER — Ambulatory Visit (INDEPENDENT_AMBULATORY_CARE_PROVIDER_SITE_OTHER): Payer: Medicaid Other | Admitting: Obstetrics

## 2015-05-14 VITALS — BP 111/73 | HR 91 | Temp 97.8°F | Wt 140.2 lb

## 2015-05-14 DIAGNOSIS — Z3483 Encounter for supervision of other normal pregnancy, third trimester: Secondary | ICD-10-CM | POA: Diagnosis not present

## 2015-05-14 LAB — POCT URINALYSIS DIPSTICK
BILIRUBIN UA: NEGATIVE
Blood, UA: NEGATIVE
Glucose, UA: NORMAL
KETONES UA: NEGATIVE
Leukocytes, UA: NEGATIVE
Nitrite, UA: NEGATIVE
PH UA: 8
Protein, UA: NEGATIVE
Spec Grav, UA: <=1.005
UROBILINOGEN UA: NEGATIVE

## 2015-05-14 NOTE — Progress Notes (Signed)
Subjective:    Erin Torres is a 24 y.o. female being seen today for her obstetrical visit. She is at [redacted]w[redacted]d gestation. Patient reports no complaints. Fetal movement: normal.  Problem List Items Addressed This Visit    None    Visit Diagnoses    Encounter for supervision of other normal pregnancy in second trimester    -  Primary    Relevant Orders    POCT urinalysis dipstick (Completed)      Patient Active Problem List   Diagnosis Date Noted  . Hx of migraines 12/17/2014   Objective:    BP 111/73 mmHg  Pulse 91  Temp(Src) 97.8 F (36.6 C)  Wt 140 lb 3.2 oz (63.594 kg)  LMP 08/31/2014 (LMP Unknown) FHT:  150 BPM  Uterine Size: size equals dates  Presentation: unsure     Assessment:    Pregnancy @ [redacted]w[redacted]d weeks   Plan:     labs reviewed, problem list updated Consent signed. GBS sent TDAP offered  Rhogam given for RH negative Pediatrician: discussed. Infant feeding: plans to breastfeed. Maternity leave: discussed. Cigarette smoking: never smoked. Orders Placed This Encounter  Procedures  . POCT urinalysis dipstick   No orders of the defined types were placed in this encounter.   Follow up in 1 Week.

## 2015-05-21 ENCOUNTER — Encounter: Payer: Medicaid Other | Admitting: Obstetrics

## 2015-05-25 ENCOUNTER — Encounter: Payer: Self-pay | Admitting: Obstetrics

## 2015-05-25 ENCOUNTER — Ambulatory Visit (INDEPENDENT_AMBULATORY_CARE_PROVIDER_SITE_OTHER): Payer: Medicaid Other | Admitting: Obstetrics

## 2015-05-25 VITALS — BP 110/65 | HR 95 | Temp 98.4°F | Wt 144.0 lb

## 2015-05-25 DIAGNOSIS — Z3483 Encounter for supervision of other normal pregnancy, third trimester: Secondary | ICD-10-CM

## 2015-05-25 LAB — POCT URINALYSIS DIPSTICK
BILIRUBIN UA: NEGATIVE
GLUCOSE UA: NORMAL
Ketones, UA: NEGATIVE
Leukocytes, UA: NEGATIVE
Nitrite, UA: NEGATIVE
Protein, UA: NEGATIVE
RBC UA: NEGATIVE
SPEC GRAV UA: 1.01
Urobilinogen, UA: NEGATIVE
pH, UA: 7

## 2015-05-25 NOTE — Progress Notes (Signed)
Subjective:    Erin Torres is a 24 y.o. female being seen today for her obstetrical visit. She is at [redacted]w[redacted]d gestation. Patient reports no complaints. Fetal movement: normal.  Problem List Items Addressed This Visit    None    Visit Diagnoses    Encounter for supervision of other normal pregnancy in third trimester    -  Primary    Relevant Orders    POCT urinalysis dipstick (Completed)      Patient Active Problem List   Diagnosis Date Noted  . Hx of migraines 12/17/2014    Objective:    BP 110/65 mmHg  Pulse 95  Temp(Src) 98.4 F (36.9 C)  Wt 144 lb (65.318 kg)  LMP 08/31/2014 (LMP Unknown) FHT: 150 BPM  Uterine Size: size equals dates  Presentations: unsure    Assessment:    Pregnancy @ [redacted]w[redacted]d weeks   Plan:   Plans for delivery: Vaginal anticipated; labs reviewed; problem list updated Counseling: Consent signed. Infant feeding: plans to breastfeed. Cigarette smoking: never smoked. L&D discussion: symptoms of labor, discussed when to call, discussed what number to call, anesthetic/analgesic options reviewed and delivering clinician:  plans no preference. Postpartum supports and preparation: circumcision discussed and contraception plans discussed.  Follow up in 1 Week.

## 2015-06-01 ENCOUNTER — Encounter: Payer: Medicaid Other | Admitting: Obstetrics

## 2015-06-02 ENCOUNTER — Ambulatory Visit (INDEPENDENT_AMBULATORY_CARE_PROVIDER_SITE_OTHER): Payer: Medicaid Other | Admitting: Obstetrics

## 2015-06-02 ENCOUNTER — Encounter: Payer: Self-pay | Admitting: Obstetrics

## 2015-06-02 VITALS — BP 115/67 | HR 99 | Temp 98.2°F | Wt 145.0 lb

## 2015-06-02 DIAGNOSIS — Z3483 Encounter for supervision of other normal pregnancy, third trimester: Secondary | ICD-10-CM

## 2015-06-02 LAB — POCT URINALYSIS DIPSTICK
Bilirubin, UA: NEGATIVE
Blood, UA: NEGATIVE
GLUCOSE UA: NORMAL
Ketones, UA: NEGATIVE
Leukocytes, UA: NEGATIVE
Nitrite, UA: NEGATIVE
SPEC GRAV UA: 1.02
Urobilinogen, UA: 1
pH, UA: 6

## 2015-06-02 NOTE — Addendum Note (Signed)
Addended by: Carole Binning on: 06/02/2015 04:36 PM   Modules accepted: Orders

## 2015-06-02 NOTE — Progress Notes (Signed)
Subjective:    Erin Torres is a 24 y.o. female being seen today for her obstetrical visit. She is at [redacted]w[redacted]d gestation. Patient reports no complaints. Fetal movement: normal.  Problem List Items Addressed This Visit    None     Patient Active Problem List   Diagnosis Date Noted  . Hx of migraines 12/17/2014    Objective:    BP 115/67 mmHg  Pulse 99  Temp(Src) 98.2 F (36.8 C)  Wt 145 lb (65.772 kg)  LMP 08/31/2014 (LMP Unknown) FHT: 150 BPM  Uterine Size: size equals dates  Presentations: cephalic  Pelvic Exam:              Dilation: 1cm       Effacement: 25%             Station:  -2    Consistency: medium            Position: posterior     Assessment:    Pregnancy @ [redacted]w[redacted]d weeks   Plan:   Plans for delivery: Vaginal anticipated; labs reviewed; problem list updated Counseling: Consent signed. Infant feeding: plans to breastfeed. Cigarette smoking: never smoked. L&D discussion: symptoms of labor, discussed when to call, discussed what number to call, anesthetic/analgesic options reviewed and delivering clinician:  plans no preference. Postpartum supports and preparation: circumcision discussed and contraception plans discussed.  Follow up in 1 Week.

## 2015-06-04 LAB — STREP B DNA PROBE: STREP GROUP B AG: NOT DETECTED

## 2015-06-05 ENCOUNTER — Other Ambulatory Visit: Payer: Self-pay | Admitting: Certified Nurse Midwife

## 2015-06-08 ENCOUNTER — Encounter (HOSPITAL_COMMUNITY): Payer: Self-pay | Admitting: *Deleted

## 2015-06-08 ENCOUNTER — Inpatient Hospital Stay (HOSPITAL_COMMUNITY)
Admission: AD | Admit: 2015-06-08 | Discharge: 2015-06-08 | Disposition: A | Discharge status: Home / Self Care | Payer: Medicaid Other | Source: Ambulatory Visit | Attending: Obstetrics | Admitting: Obstetrics

## 2015-06-08 DIAGNOSIS — Z3493 Encounter for supervision of normal pregnancy, unspecified, third trimester: Secondary | ICD-10-CM | POA: Insufficient documentation

## 2015-06-08 HISTORY — DX: Other specified health status: Z78.9

## 2015-06-08 NOTE — MAU Note (Signed)
Patient presents at [redacted] weeks gestation with c/o abdominal pain and pressure today. Fetus active. Denies bleeding but has vaginal discharge.

## 2015-06-08 NOTE — Discharge Instructions (Signed)

## 2015-06-09 ENCOUNTER — Ambulatory Visit (INDEPENDENT_AMBULATORY_CARE_PROVIDER_SITE_OTHER): Payer: Medicaid Other | Admitting: Obstetrics

## 2015-06-09 ENCOUNTER — Encounter: Payer: Self-pay | Admitting: *Deleted

## 2015-06-09 VITALS — BP 111/66 | HR 82 | Temp 98.1°F | Wt 148.0 lb

## 2015-06-09 DIAGNOSIS — Z3483 Encounter for supervision of other normal pregnancy, third trimester: Secondary | ICD-10-CM

## 2015-06-09 DIAGNOSIS — O480 Post-term pregnancy: Secondary | ICD-10-CM | POA: Diagnosis not present

## 2015-06-09 LAB — POCT URINALYSIS DIPSTICK
BILIRUBIN UA: NEGATIVE
Blood, UA: NEGATIVE
GLUCOSE UA: NEGATIVE
Ketones, UA: NEGATIVE
LEUKOCYTES UA: NEGATIVE
Nitrite, UA: NEGATIVE
PROTEIN UA: NEGATIVE
SPEC GRAV UA: 1.015
Urobilinogen, UA: 4
pH, UA: 7

## 2015-06-10 NOTE — Progress Notes (Signed)
Subjective:    Erin Torres is a 24 y.o. female being seen today for her obstetrical visit. She is at [redacted]w[redacted]d gestation. Patient reports no complaints. Fetal movement: normal.  Problem List Items Addressed This Visit    None    Visit Diagnoses    Encounter for supervision of other normal pregnancy in third trimester    -  Primary    Relevant Orders    POCT urinalysis dipstick (Completed)      Patient Active Problem List   Diagnosis Date Noted  . Hx of migraines 12/17/2014    Objective:    BP 111/66 mmHg  Pulse 82  Temp(Src) 98.1 F (36.7 C)  Wt 148 lb (67.132 kg)  LMP 08/31/2014 (LMP Unknown) FHT:  150 BPM  Uterine Size: size equals dates  Presentation: cephalic  Pelvic Exam:              Dilation: 1cm       Effacement: 50%   Station:  -2     Consistency: medium            Position: posterior     Assessment:    Pregnancy @ [redacted]w[redacted]d  weeks   Plan:    Postdates management: discussed fetal surveillance and induction, discussed fetal movement, NST reactive, biophysical profile ordered. Induction: scheduled for 05-16-15, written information given.

## 2015-06-11 ENCOUNTER — Telehealth (HOSPITAL_COMMUNITY): Payer: Self-pay | Admitting: *Deleted

## 2015-06-11 ENCOUNTER — Encounter (HOSPITAL_COMMUNITY): Payer: Self-pay | Admitting: *Deleted

## 2015-06-11 ENCOUNTER — Other Ambulatory Visit: Payer: Self-pay | Admitting: Certified Nurse Midwife

## 2015-06-11 NOTE — Telephone Encounter (Signed)
Preadmission screen  

## 2015-06-16 ENCOUNTER — Inpatient Hospital Stay (HOSPITAL_COMMUNITY)
Admission: RE | Admit: 2015-06-16 | Discharge: 2015-06-18 | DRG: 775 | Disposition: A | Discharge status: Home / Self Care | Payer: Medicaid Other | Source: Ambulatory Visit | Attending: Obstetrics | Admitting: Obstetrics

## 2015-06-16 ENCOUNTER — Encounter (HOSPITAL_COMMUNITY): Payer: Self-pay

## 2015-06-16 DIAGNOSIS — Z8249 Family history of ischemic heart disease and other diseases of the circulatory system: Secondary | ICD-10-CM | POA: Diagnosis not present

## 2015-06-16 DIAGNOSIS — O480 Post-term pregnancy: Secondary | ICD-10-CM | POA: Diagnosis present

## 2015-06-16 DIAGNOSIS — R011 Cardiac murmur, unspecified: Secondary | ICD-10-CM | POA: Diagnosis present

## 2015-06-16 DIAGNOSIS — O9902 Anemia complicating childbirth: Secondary | ICD-10-CM | POA: Diagnosis present

## 2015-06-16 DIAGNOSIS — Z3490 Encounter for supervision of normal pregnancy, unspecified, unspecified trimester: Secondary | ICD-10-CM

## 2015-06-16 DIAGNOSIS — Z3A00 Weeks of gestation of pregnancy not specified: Secondary | ICD-10-CM | POA: Diagnosis present

## 2015-06-16 DIAGNOSIS — Z833 Family history of diabetes mellitus: Secondary | ICD-10-CM | POA: Diagnosis not present

## 2015-06-16 LAB — TYPE AND SCREEN
ABO/RH(D): A POS
ANTIBODY SCREEN: NEGATIVE

## 2015-06-16 LAB — CBC
HEMATOCRIT: 30.2 % — AB (ref 36.0–46.0)
HEMOGLOBIN: 9.8 g/dL — AB (ref 12.0–15.0)
MCH: 28.3 pg (ref 26.0–34.0)
MCHC: 32.5 g/dL (ref 30.0–36.0)
MCV: 87.3 fL (ref 78.0–100.0)
Platelets: 326 10*3/uL (ref 150–400)
RBC: 3.46 MIL/uL — ABNORMAL LOW (ref 3.87–5.11)
RDW: 14.4 % (ref 11.5–15.5)
WBC: 6.1 10*3/uL (ref 4.0–10.5)

## 2015-06-16 LAB — RPR: RPR: NONREACTIVE

## 2015-06-16 MED ORDER — OXYTOCIN 40 UNITS IN LACTATED RINGERS INFUSION - SIMPLE MED
1.0000 m[IU]/min | INTRAVENOUS | Status: DC
  Administered 2015-06-16: 2 m[IU]/min via INTRAVENOUS
  Filled 2015-06-16: qty 1000

## 2015-06-16 MED ORDER — MISOPROSTOL 25 MCG QUARTER TABLET: 25.0000 ug | ORAL_TABLET | ORAL | Status: DC | PRN

## 2015-06-16 MED ORDER — INFLUENZA VAC SPLIT QUAD 0.5 ML IM SUSY
0.5000 mL | PREFILLED_SYRINGE | INTRAMUSCULAR | Status: DC
  Filled 2015-06-16: qty 0.5

## 2015-06-16 MED ORDER — ZOLPIDEM TARTRATE 5 MG PO TABS
5.0000 mg | ORAL_TABLET | Freq: Every evening | ORAL | Status: DC | PRN
Start: 2015-06-16 — End: 2015-06-18

## 2015-06-16 MED ORDER — FENTANYL CITRATE (PF) 100 MCG/2ML IJ SOLN
INTRAMUSCULAR | Status: AC
  Administered 2015-06-16: 100 ug via INTRAVENOUS
  Filled 2015-06-16: qty 2

## 2015-06-16 MED ORDER — CITRIC ACID-SODIUM CITRATE 334-500 MG/5ML PO SOLN: 30.0000 mL | ORAL | Status: DC | PRN

## 2015-06-16 MED ORDER — TETANUS-DIPHTH-ACELL PERTUSSIS 5-2.5-18.5 LF-MCG/0.5 IM SUSP: 0.5000 mL | Freq: Once | INTRAMUSCULAR | Status: DC

## 2015-06-16 MED ORDER — OB COMPLETE PETITE 35-5-1-200 MG PO CAPS: 1.0000 | ORAL_CAPSULE | Freq: Every day | ORAL | Status: DC

## 2015-06-16 MED ORDER — DIBUCAINE 1 % RE OINT: 1.0000 "application " | TOPICAL_OINTMENT | RECTAL | Status: DC | PRN

## 2015-06-16 MED ORDER — WITCH HAZEL-GLYCERIN EX PADS: 1.0000 "application " | MEDICATED_PAD | CUTANEOUS | Status: DC | PRN

## 2015-06-16 MED ORDER — OXYTOCIN BOLUS FROM INFUSION
500.0000 mL | INTRAVENOUS | Status: DC
  Administered 2015-06-16: 500 mL via INTRAVENOUS

## 2015-06-16 MED ORDER — LANOLIN HYDROUS EX OINT: TOPICAL_OINTMENT | CUTANEOUS | Status: DC | PRN

## 2015-06-16 MED ORDER — FENTANYL CITRATE (PF) 100 MCG/2ML IJ SOLN
100.0000 ug | INTRAMUSCULAR | Status: DC | PRN
  Administered 2015-06-16: 100 ug via INTRAVENOUS

## 2015-06-16 MED ORDER — ACETAMINOPHEN 325 MG PO TABS: 650.0000 mg | ORAL_TABLET | ORAL | Status: DC | PRN

## 2015-06-16 MED ORDER — OXYTOCIN 40 UNITS IN LACTATED RINGERS INFUSION - SIMPLE MED: 62.5000 mL/h | INTRAVENOUS | Status: DC | PRN

## 2015-06-16 MED ORDER — OXYCODONE-ACETAMINOPHEN 5-325 MG PO TABS: 1.0000 | ORAL_TABLET | ORAL | Status: DC | PRN

## 2015-06-16 MED ORDER — ONDANSETRON HCL 4 MG/2ML IJ SOLN: 4.0000 mg | Freq: Four times a day (QID) | INTRAMUSCULAR | Status: DC | PRN

## 2015-06-16 MED ORDER — TERBUTALINE SULFATE 1 MG/ML IJ SOLN
0.2500 mg | Freq: Once | INTRAMUSCULAR | Status: AC | PRN
  Administered 2015-06-16: 0.25 mg via SUBCUTANEOUS
  Filled 2015-06-16: qty 1

## 2015-06-16 MED ORDER — LACTATED RINGERS IV SOLN
500.0000 mL | INTRAVENOUS | Status: DC | PRN
  Administered 2015-06-16 (×2): 500 mL via INTRAVENOUS

## 2015-06-16 MED ORDER — FLEET ENEMA 7-19 GM/118ML RE ENEM: 1.0000 | ENEMA | RECTAL | Status: DC | PRN

## 2015-06-16 MED ORDER — OXYCODONE-ACETAMINOPHEN 5-325 MG PO TABS: 2.0000 | ORAL_TABLET | ORAL | Status: DC | PRN

## 2015-06-16 MED ORDER — ONDANSETRON HCL 4 MG PO TABS: 4.0000 mg | ORAL_TABLET | ORAL | Status: DC | PRN

## 2015-06-16 MED ORDER — LACTATED RINGERS IV SOLN
INTRAVENOUS | Status: DC
  Administered 2015-06-16: 125 mL/h via INTRAVENOUS
  Administered 2015-06-16: 11:00:00 via INTRAVENOUS

## 2015-06-16 MED ORDER — PRENATAL MULTIVITAMIN CH
1.0000 | ORAL_TABLET | Freq: Every day | ORAL | Status: DC
  Administered 2015-06-17 – 2015-06-18 (×2): 1 via ORAL
  Filled 2015-06-16 (×2): qty 1

## 2015-06-16 MED ORDER — BENZOCAINE-MENTHOL 20-0.5 % EX AERO: 1.0000 "application " | INHALATION_SPRAY | CUTANEOUS | Status: DC | PRN

## 2015-06-16 MED ORDER — METHYLERGONOVINE MALEATE 0.2 MG/ML IJ SOLN: 0.2000 mg | INTRAMUSCULAR | Status: DC | PRN

## 2015-06-16 MED ORDER — SENNOSIDES-DOCUSATE SODIUM 8.6-50 MG PO TABS
2.0000 | ORAL_TABLET | ORAL | Status: DC
  Administered 2015-06-16 – 2015-06-18 (×2): 2 via ORAL
  Filled 2015-06-16 (×2): qty 2

## 2015-06-16 MED ORDER — ONDANSETRON HCL 4 MG/2ML IJ SOLN: 4.0000 mg | INTRAMUSCULAR | Status: DC | PRN

## 2015-06-16 MED ORDER — MISOPROSTOL 200 MCG PO TABS
50.0000 ug | ORAL_TABLET | ORAL | Status: DC | PRN
  Administered 2015-06-16: 50 ug via ORAL
  Filled 2015-06-16: qty 0.5

## 2015-06-16 MED ORDER — IBUPROFEN 600 MG PO TABS
600.0000 mg | ORAL_TABLET | Freq: Four times a day (QID) | ORAL | Status: DC
  Administered 2015-06-16 – 2015-06-18 (×7): 600 mg via ORAL
  Filled 2015-06-16 (×7): qty 1

## 2015-06-16 MED ORDER — DIPHENHYDRAMINE HCL 25 MG PO CAPS: 25.0000 mg | ORAL_CAPSULE | Freq: Four times a day (QID) | ORAL | Status: DC | PRN

## 2015-06-16 MED ORDER — TERBUTALINE SULFATE 1 MG/ML IJ SOLN
0.2500 mg | Freq: Once | INTRAMUSCULAR | Status: DC | PRN
  Filled 2015-06-16: qty 1

## 2015-06-16 MED ORDER — OXYTOCIN 40 UNITS IN LACTATED RINGERS INFUSION - SIMPLE MED: 62.5000 mL/h | INTRAVENOUS | Status: DC

## 2015-06-16 MED ORDER — LIDOCAINE HCL (PF) 1 % IJ SOLN
30.0000 mL | INTRAMUSCULAR | Status: DC | PRN
  Administered 2015-06-16: 30 mL via SUBCUTANEOUS
  Filled 2015-06-16: qty 30

## 2015-06-16 MED ORDER — METHYLERGONOVINE MALEATE 0.2 MG PO TABS: 0.2000 mg | ORAL_TABLET | ORAL | Status: DC | PRN

## 2015-06-16 MED ORDER — SIMETHICONE 80 MG PO CHEW: 80.0000 mg | CHEWABLE_TABLET | ORAL | Status: DC | PRN

## 2015-06-16 NOTE — H&P (Signed)
Erin Torres is a 24 y.o. female presenting for IOL for post dates. History OB History    Gravida Para Term Preterm AB TAB SAB Ectopic Multiple Living   3 1 1  1  1   1      Past Medical History  Diagnosis Date  . No pertinent past medical history   . Medical history non-contributory    Past Surgical History  Procedure Laterality Date  . No past surgeries     Family History: family history includes Diabetes in her paternal grandmother; Hypertension in her maternal grandmother and paternal grandmother. There is no history of Anesthesia problems. Social History:  reports that she has never smoked. She has never used smokeless tobacco. She reports that she does not drink alcohol or use illicit drugs.   Prenatal Transfer Tool  Maternal Diabetes: No Genetic Screening: Normal Maternal Ultrasounds/Referrals: Normal Fetal Ultrasounds or other Referrals:  None Maternal Substance Abuse:  No Significant Maternal Medications:  None Significant Maternal Lab Results:  None Other Comments:  None  ROS  Dilation: 1 Effacement (%): 50 Station: -2 Exam by:: Shirlene Andaya CNM Blood pressure 94/72, pulse 93, temperature 98.1 F (36.7 C), temperature source Oral, resp. rate 18, height 5\' 4"  (1.626 m), weight 148 lb (67.132 kg), last menstrual period 08/31/2014, unknown if currently breastfeeding. Exam Physical Exam  Prenatal labs: ABO, Rh: A/POS/-- (02/05 1505) Antibody: NEG (02/05 1505) Rubella: 1.80 (02/05 1505) RPR: NON REAC (06/09 0949)  HBsAg: NEGATIVE (02/05 1505)  HIV: NONREACTIVE (06/09 0949)  GBS: NOT DETECTED (08/23 1638)   Assessment/Plan: IOL for post dates.  Cytotec PO.  Patient declines epidural at this point.  Desires IVP pain meds.     Jaydenn Boccio A Iain Sawchuk 06/16/2015, 8:47 AM

## 2015-06-17 LAB — CBC
HCT: 23.1 % — ABNORMAL LOW (ref 36.0–46.0)
Hemoglobin: 7.6 g/dL — ABNORMAL LOW (ref 12.0–15.0)
MCH: 28.7 pg (ref 26.0–34.0)
MCHC: 32.9 g/dL (ref 30.0–36.0)
MCV: 87.2 fL (ref 78.0–100.0)
PLATELETS: 244 10*3/uL (ref 150–400)
RBC: 2.65 MIL/uL — AB (ref 3.87–5.11)
RDW: 14.4 % (ref 11.5–15.5)
WBC: 10.6 10*3/uL — ABNORMAL HIGH (ref 4.0–10.5)

## 2015-06-17 MED ORDER — FERROUS SULFATE 325 (65 FE) MG PO TABS
325.0000 mg | ORAL_TABLET | Freq: Three times a day (TID) | ORAL | Status: DC
  Administered 2015-06-17 – 2015-06-18 (×3): 325 mg via ORAL
  Filled 2015-06-17 (×3): qty 1

## 2015-06-17 NOTE — Lactation Note (Deleted)
This note was copied from the chart of [employer]. Lactation Consultation Note- Follow up on mother in her room. Mom was pleasant and smiling. She reported that pumping is going well and that she was excited that she received 5 ml that she took to NICU for baby last night. She is having breakfast and plans to visit baby in NICU for STS prior to pumping this morning. Mom's friend has a pump for her to use once going home. Discussed pumping after d/c and that pumps are available for use in NICU when visiting baby. Told to call PRN questions/concerns.  Patient Name: Erin Io Dieujuste IRCVE'L Date: 06/17/2015     Maternal Data    Feeding    LATCH Score/Interventions                      Lactation Tools Discussed/Used     Consult Status      Donn Pierini 06/17/2015, 10:19 AM

## 2015-06-17 NOTE — Progress Notes (Signed)
Post Partum Day #1 Subjective: no complaints, voiding and tolerating PO  Objective: Blood pressure 117/63, pulse 85, temperature 98.4 F (36.9 C), temperature source Oral, resp. rate 18, height 5\' 4"  (1.626 m), weight 148 lb (67.132 kg), last menstrual period 08/31/2014, SpO2 99 %, unknown if currently breastfeeding.  Physical Exam:  General: alert, cooperative and no distress Lochia: appropriate Uterine Fundus: firm Incision: healing well DVT Evaluation: No evidence of DVT seen on physical exam. Negative Homan's sign. No cords or calf tenderness. No significant calf/ankle edema.   Recent Labs  06/16/15 0838 06/17/15 0613  HGB 9.8* 7.6*  HCT 30.2* 23.1*    Assessment/Plan: Anemia Plan for discharge tomorrow and Breastfeeding   LOS: 1 day   Erin Torres A Erin Torres 06/17/2015, 8:37 AM

## 2015-06-17 NOTE — Progress Notes (Signed)
UR chart review completed.  

## 2015-06-18 MED ORDER — FERIVA 21/7 75-1 MG PO TABS: 1.0000 | ORAL_TABLET | Freq: Every day | ORAL | Status: DC

## 2015-06-18 MED ORDER — SENNOSIDES-DOCUSATE SODIUM 8.6-50 MG PO TABS: 2.0000 | ORAL_TABLET | ORAL | Status: DC

## 2015-06-18 MED ORDER — OXYCODONE-ACETAMINOPHEN 5-325 MG PO TABS: 2.0000 | ORAL_TABLET | ORAL | Status: DC | PRN

## 2015-06-18 MED ORDER — IBUPROFEN 800 MG PO TABS: 800.0000 mg | ORAL_TABLET | Freq: Three times a day (TID) | ORAL | Status: DC | PRN

## 2015-06-18 NOTE — Discharge Instructions (Signed)
Call [facility] to schedule 2 week postpartum visit.    Postpartum Care After Vaginal Delivery After you deliver your baby, you will stay in the hospital for 24 to 72 hours, unless there were problems with the labor or delivery, or you have medical problems. While you are in the hospital, you will receive help and instructions on how to care for yourself and your baby. Your doctor will order pain medicine, in case you need it. You will have a small amount of bleeding from your vagina and should change your sanitary pad frequently. Wash your hands thoroughly with soap and water for at least 20 seconds after changing pads and using the toilet. Let the nurses know if you begin to pass blood clots or your bleeding increases. Do not flush blood clots down the toilet before having the nurse look at them, to make sure there is no placental tissue with them. If you had an intravenous, it will be removed within 24 hours, if there are no problems. The first time you get out of bed or take a shower, call the nurse to help you because you may get weak, lightheaded, or even faint. If you are breastfeeding, you may feel painful contractions of your uterus for a couple of weeks. This is normal. The contractions help your uterus get back to normal size. If you are not breastfeeding, wear a tight, binding bra and decrease your fluid intake. You may be given a medicine to dry up the milk in your breasts. Hormones should not be given to dry up the breasts, because they can cause blood clots. You will be given your normal diet, unless you have diabetes or other medical problems.  The nurses may put an ice pack on your episiotomy (surgically enlarged opening), if you have one, to reduce the pain and puffiness (swelling). On rare occasions, you may not be able to urinate and the nurse will need to empty your bladder with a catheter. If you had a postpartum tubal ligation ("tying tubes," female sterilization), it should not make your  stay in the hospital longer. You may have your baby in your room with you as much as you like, unless you or the baby has a problem. Use the bassinet (basket) for the baby when going to and from the nursery. Do not carry the baby. Do not leave the postpartum area. If the mother is Rh negative (lacks a protein on the red blood cells) and the baby is Rh positive, the mother should get a Rho-gam shot to prevent Rh problems with future pregnancies. You may be given written instructions for you and your baby, and necessary medicines, when you are discharged from the hospital. Be sure you understand and follow the instructions as advised. HOME CARE INSTRUCTIONS AFTER YOUR DELIVERY:  Follow instructions and take the medicines given to you.   Only take over-the-counter or prescription medicines for pain, discomfort, or fever as directed by your caregiver.   Do not take aspirin, because it can cause bleeding.   Increase your activities a little bit every day to build up your strength and endurance.   Do not drink alcohol, especially if you are breastfeeding or taking pain medicine.   Take your temperature twice a day and record it.   You may have a small amount of bleeding or spotting for 2 to 4 weeks. This is normal.   Do not use tampons or douche. Use sanitary pads.   Try to have someone stay and help  you for a few days when you go home.   Try to rest or take a nap when the baby is sleeping.   If you are breastfeeding, wear a good support bra. If you are not breastfeeding, wear a tight bra, do not stimulate your nipples, and decrease your fluid intake.   Eat a healthy, nutritious diet and continue to take your prenatal vitamins.   Do not drive, do any heavy activities or travel until your caregiver tells you it is OK.   Do not have intercourse until your caregiver gives you permission to do so.   Ask your caregiver when you can begin to exercise and what type of exercises to do.   Call  your caregiver if you think you are having a problem from your delivery.   Call your pediatrician if you are having a problem with the baby.   Schedule your postpartum visit and keep it.  SEEK MEDICAL CARE IF:  You have a temperature of 100 F (37.8 C) or higher.   You have increased vaginal bleeding or are passing clots. Save any clots to show your caregiver.   You have bloody urine, or pain when you urinate.   You have a bad smelling vaginal discharge.   You have increasing pain or swelling on your episiotomy (surgically enlarged opening).   You develop a severe headache.   You feel depressed.   The episiotomy is separating.   You become dizzy or lightheaded.   You develop a rash.   You have a reaction or problems with your medicine.   You have pain, redness, and/or swelling at the intravenous site.  SEEK IMMEDIATE MEDICAL CARE IF:  You have chest pain.   You develop shortness of breath.   You pass out.   You develop pain, with or without swelling or redness in your leg.   You develop heavy vaginal bleeding, with or without blood clots.   You develop stomach pain.   You develop a bad smelling vaginal discharge.  MAKE SURE YOU:   Understand these instructions.   Will watch your condition.   Will get help right away if you are not doing well or get worse.  Document Released: 07/24/2007 Document Re-Released: 09/14/2009 [facility] Patient Information 2011 [place].

## 2015-06-18 NOTE — Progress Notes (Signed)
Post Partum Day #2 Subjective: no complaints, up ad lib, voiding, tolerating PO and + flatus  Objective: Blood pressure 111/61, pulse 79, temperature 98.9 F (37.2 C), temperature source Oral, resp. rate 16, height 5\' 4"  (1.626 m), weight 148 lb (67.132 kg), last menstrual period 08/31/2014, SpO2 98 %, unknown if currently breastfeeding.  Physical Exam:  General: alert, cooperative and no distress Lochia: appropriate Uterine Fundus: firm Incision: healing well DVT Evaluation: No evidence of DVT seen on physical exam. Negative Homan's sign. No cords or calf tenderness. No significant calf/ankle edema.   Recent Labs  06/16/15 0838 06/17/15 0613  HGB 9.8* 7.6*  HCT 30.2* 23.1*    Assessment/Plan: Discharge home and Breastfeeding, F/U in clinic in 2 weeks for postpartum visit.    LOS: 2 days   Rachelle A Denney 06/18/2015, 8:27 AM

## 2015-06-18 NOTE — Discharge Summary (Signed)
Obstetric Discharge Summary Reason for Admission: induction of labor Prenatal Procedures: ultrasound Intrapartum Procedures: spontaneous vaginal delivery Postpartum Procedures: none Complications-Operative and Postpartum: 1st degree perineal laceration HEMOGLOBIN  Date Value Ref Range Status  06/17/2015 7.6* 12.0 - 15.0 g/dL Final    Comment:    REPEATED TO VERIFY DELTA CHECK NOTED    HCT  Date Value Ref Range Status  06/17/2015 23.1* 36.0 - 46.0 % Final    Physical Exam:  General: alert, cooperative and no distress Lochia: appropriate Uterine Fundus: firm Incision: healing well DVT Evaluation: No evidence of DVT seen on physical exam. Negative Homan's sign. No cords or calf tenderness. No significant calf/ankle edema. Cardiac: +3 murmur noted on exam, to f/u with postpartum visit  Discharge Diagnoses: Term Pregnancy-delivered, Anemia, Cardiac murmur  Discharge Information: Date: 06/18/2015 Activity: pelvic rest Diet: routine Medications: PNV, Ibuprofen, Colace, Iron and Percocet Condition: stable Instructions: refer to practice specific booklet Discharge to: home   Newborn Data: Live born female  Birth Weight: 7 lb 11.4 oz (3498 g) APGAR: 8, 9  Home with mother.  Erin Torres 06/18/2015, 8:28 AM

## 2015-06-30 ENCOUNTER — Ambulatory Visit: Payer: Medicaid Other | Admitting: Obstetrics

## 2015-07-07 ENCOUNTER — Encounter: Payer: Self-pay | Admitting: Obstetrics

## 2015-07-07 ENCOUNTER — Ambulatory Visit (INDEPENDENT_AMBULATORY_CARE_PROVIDER_SITE_OTHER): Payer: Medicaid Other | Admitting: Obstetrics

## 2015-07-07 DIAGNOSIS — Z3009 Encounter for other general counseling and advice on contraception: Secondary | ICD-10-CM

## 2015-07-07 MED ORDER — MEDROXYPROGESTERONE ACETATE 150 MG/ML IM SUSP
150.0000 mg | INTRAMUSCULAR | Status: DC
Start: 2015-07-07 — End: 2015-10-23

## 2015-07-08 ENCOUNTER — Encounter: Payer: Self-pay | Admitting: Obstetrics

## 2015-07-08 NOTE — Progress Notes (Signed)
Subjective:     Erin Torres is a 24 y.o. female who presents for a postpartum visit. She is 2 weeks postpartum following a spontaneous vaginal delivery. I have fully reviewed the prenatal and intrapartum course. The delivery was at 83 gestational weeks. Outcome: spontaneous vaginal delivery. Anesthesia: epidural. Postpartum course has been normal. Baby's course has been normal. Baby is feeding by breast. Bleeding thin lochia. Bowel function is normal. Bladder function is normal. Patient is not sexually active. Contraception method is abstinence. Postpartum depression screening: negative.  Tobacco, alcohol and substance abuse history reviewed.  Adult immunizations reviewed including TDAP, rubella and varicella.  The following portions of the patient's history were reviewed and updated as appropriate: allergies, current medications, past family history, past medical history, past social history, past surgical history and problem list.  Review of Systems A comprehensive review of systems was negative.   Objective:    BP 107/63 mmHg  Pulse 72  Temp(Src) 98.3 F (36.8 C)  Wt 132 lb (59.875 kg)  LMP   Breastfeeding? Yes   PE:  Deferred   100% of 10 min visit spent on counseling and coordination of care.   Assessment:     Normal postpartum exam. Pap smear not done at today's visit.  Plan:    1. Contraception: Depo-Provera injections 2. Depo Provera Rx 3. Follow up in: 4 weeks or as needed.   Healthy lifestyle practices reviewed

## 2015-08-04 ENCOUNTER — Ambulatory Visit: Payer: Medicaid Other | Admitting: Obstetrics

## 2015-10-23 ENCOUNTER — Emergency Department (HOSPITAL_COMMUNITY)
Admission: EM | Admit: 2015-10-23 | Discharge: 2015-10-23 | Disposition: A | Discharge status: Home / Self Care | Payer: Medicaid Other | Attending: Emergency Medicine | Admitting: Emergency Medicine

## 2015-10-23 ENCOUNTER — Encounter: Payer: Self-pay | Admitting: Obstetrics

## 2015-10-23 ENCOUNTER — Ambulatory Visit (INDEPENDENT_AMBULATORY_CARE_PROVIDER_SITE_OTHER): Payer: Medicaid Other | Admitting: Obstetrics

## 2015-10-23 ENCOUNTER — Encounter (HOSPITAL_COMMUNITY): Payer: Self-pay | Admitting: Emergency Medicine

## 2015-10-23 VITALS — BP 99/62 | HR 74 | Temp 97.9°F | Wt 116.0 lb

## 2015-10-23 DIAGNOSIS — J039 Acute tonsillitis, unspecified: Secondary | ICD-10-CM | POA: Diagnosis not present

## 2015-10-23 DIAGNOSIS — Z3009 Encounter for other general counseling and advice on contraception: Secondary | ICD-10-CM

## 2015-10-23 DIAGNOSIS — Z7952 Long term (current) use of systemic steroids: Secondary | ICD-10-CM | POA: Diagnosis not present

## 2015-10-23 DIAGNOSIS — J029 Acute pharyngitis, unspecified: Secondary | ICD-10-CM | POA: Diagnosis present

## 2015-10-23 LAB — RAPID STREP SCREEN (MED CTR MEBANE ONLY): STREPTOCOCCUS, GROUP A SCREEN (DIRECT): NEGATIVE

## 2015-10-23 MED ORDER — PREDNISONE 20 MG PO TABS
40.0000 mg | ORAL_TABLET | Freq: Once | ORAL | Status: AC
Start: 2015-10-23 — End: 2015-10-23
  Administered 2015-10-23: 40 mg via ORAL
  Filled 2015-10-23: qty 2

## 2015-10-23 MED ORDER — PREDNISONE 20 MG PO TABS: 40.0000 mg | ORAL_TABLET | Freq: Every day | ORAL | Status: DC

## 2015-10-23 NOTE — ED Provider Notes (Signed)
CSN: [id number]     Arrival date & time 10/23/15  [id number] History  By signing my name below, I, Rayna Sexton, attest that this documentation has been prepared under the direction and in the presence of Quintavius Niebuhr Y Jamiere Gulas, [place]. Electronically Signed: Rayna Sexton, ED Scribe. 10/23/2015. 7:45 PM.    Chief Complaint  Patient presents with  . Sore Throat   The history is provided by the patient. No language interpreter was used.    HPI Comments: Erin Torres is a 25 y.o. female who is otherwise healthy presents to the Emergency Department complaining of a constant, mild, bilateral throat swelling with onset earlier today. She notes associated, mild, diffuse, abd pain. Denies difficulty swallowing further noting that she is currently able to eat and drink., but it feels "different" and "funny." Pt notes recently having experienced cold-like symptoms which have mostly alleviated. Pt denies sore throat or any other associated symptoms at this time.    Past Medical History  Diagnosis Date  . No pertinent past medical history   . Medical history non-contributory    Past Surgical History  Procedure Laterality Date  . No past surgeries     Family History  Problem Relation Age of Onset  . Anesthesia problems Neg Hx   . Hypertension Maternal Grandmother   . Hypertension Paternal Grandmother   . Diabetes Paternal Grandmother    Social History  Substance Use Topics  . Smoking status: Never Smoker   . Smokeless tobacco: Never Used  . Alcohol Use: No   OB History    Gravida Para Term Preterm AB TAB SAB Ectopic Multiple Living   3 2 2  1  1   0 2     Review of Systems  All other systems reviewed and are negative.   Allergies  Review of patient's allergies indicates no known allergies.  Home Medications   Prior to Admission medications   Medication Sig Start Date End Date Taking? Authorizing Provider  predniSONE (DELTASONE) 20 MG tablet Take 2 tablets (40 mg total) by mouth daily.  10/23/15   Olivia Canter Jurnei Latini, PA-C   BP 121/81 mmHg  Pulse 77  Temp(Src) 98.6 F (37 C) (Oral)  Resp 16  SpO2 100%  LMP 10/10/2015 (Exact Date)    Physical Exam  Constitutional: She is oriented to person, place, and time. She appears well-developed and well-nourished.  HENT:  Head: Normocephalic and atraumatic.  Mouth/Throat: Mucous membranes are normal.  Symmetric enlargement of bilateral tonsils with mild erythema. No exudate. Uvula is midline. No dysphagia, trismus, or drooling.  Eyes: Conjunctivae and EOM are normal.  Neck: Normal range of motion. No tracheal deviation present.  Cardiovascular: Normal rate.   Pulmonary/Chest: Effort normal. No respiratory distress.  Abdominal: Soft. She exhibits no distension. There is no tenderness.  Musculoskeletal: Normal range of motion.  Lymphadenopathy:    She has no cervical adenopathy.  Neurological: She is alert and oriented to person, place, and time.  Skin: Skin is warm and dry. She is not diaphoretic.  Psychiatric: She has a normal mood and affect. Her behavior is normal.  Nursing note and vitals reviewed.   ED Course  Procedures  DIAGNOSTIC STUDIES: Oxygen Saturation is 100% on RA, normal by my interpretation.    COORDINATION OF CARE: 7:44 PM Pt presents today due to mild throat swelling. Discussed next steps with pt including a rapid strep screen and she agreed to the plan.   Labs Review Labs Reviewed  RAPID STREP SCREEN (NOT AT  [facility])  CULTURE, GROUP A STREP [facility])    Imaging Review No results found. I have personally reviewed and evaluated these lab results as part of my medical decision-making.   EKG Interpretation None      MDM   Final diagnoses:  Tonsillitis    Rapid strep negative. Likely viral tonsillitis. Will give prednisone to help with inflammation. Discussed that pt may take motrin if she starts experiencing sore throat. Will give referral to ENT for follow up. ER return precautions given.   I  personally performed the services described in this documentation, which was scribed in my presence. The recorded information has been reviewed and is accurate.   Anne Ng, PA-C 10/23/15 2041  Merrily Pew, MD 10/26/15 [phone]
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
## 2015-10-23 NOTE — Progress Notes (Signed)
Subjective:    Erin Torres is a 25 y.o. female who presents for contraception counseling. The patient has no complaints today. The patient is sexually active. Pertinent past medical history: none.  The information documented in the HPI was reviewed and verified.  Menstrual History: OB History    Gravida Para Term Preterm AB TAB SAB Ectopic Multiple Living   3 2 2  1  1   0 2      Menarche age: 81  Patient's last menstrual period was 10/10/2015.   Patient Active Problem List   Diagnosis Date Noted  . Pregnancy 06/16/2015  . NSVD (normal spontaneous vaginal delivery) 06/16/2015  . Hx of migraines 12/17/2014   Past Medical History  Diagnosis Date  . No pertinent past medical history   . Medical history non-contributory     Past Surgical History  Procedure Laterality Date  . No past surgeries      No current outpatient prescriptions on file. No Known Allergies  Social History  Substance Use Topics  . Smoking status: Never Smoker   . Smokeless tobacco: Never Used  . Alcohol Use: No    Family History  Problem Relation Age of Onset  . Anesthesia problems Neg Hx   . Hypertension Maternal Grandmother   . Hypertension Paternal Grandmother   . Diabetes Paternal Grandmother        Review of Systems Constitutional: negative for weight loss Genitourinary:negative for abnormal menstrual periods and vaginal discharge   Objective:   BP 99/62 mmHg  Pulse 74  Temp(Src) 97.9 F (36.6 C)  Wt 116 lb (52.617 kg)  LMP 10/10/2015  Breastfeeding? Yes  PE:  Deferred  Lab Review Urine pregnancy test Labs reviewed yes Radiologic studies reviewed no  100% of 10 min visit spent on counseling and coordination of care.   Assessment:    25 y.o., starting IUD, no contraindications.   Plan:    All questions answered. Discussed healthy lifestyle modifications. Neurosurgeon distributed. F/U when period starts for IUD insertion    No orders of the defined  types were placed in this encounter.   No orders of the defined types were placed in this encounter.   Need to obtain previous records Follow up as needed.

## 2015-10-23 NOTE — Discharge Instructions (Signed)
Your rapid strep test was negative. You likely have viral tonsillitis. It will just take time for your body to heal on its own. To help with the inflammation I will give you a course of steroids. If you start experiencing pain/sore throat you may take 800mg  ibuprofen (motrin/advil) as needed. Please call ENT to schedule a follow up appointment. I gave you their contact information with this paperwork. Return to the ER immediately for new or worsening symptoms such as high fever, inability to swallow, drooling, inability to open your mouth all the way.   Tonsillitis Tonsillitis is an infection of the throat that causes the tonsils to become red, tender, and swollen. Tonsils are collections of lymphoid tissue at the back of the throat. Each tonsil has crevices (crypts). Tonsils help fight nose and throat infections and keep infection from spreading to other parts of the body for the first 18 months of life.  CAUSES Sudden (acute) tonsillitis is usually caused by infection with streptococcal bacteria. Long-lasting (chronic) tonsillitis occurs when the crypts of the tonsils become filled with pieces of food and bacteria, which makes it easy for the tonsils to become repeatedly infected. SYMPTOMS  Symptoms of tonsillitis include:  A sore throat, with possible difficulty swallowing.  White patches on the tonsils.  Fever.  Tiredness.  New episodes of snoring during sleep, when you did not snore before.  Small, foul-smelling, yellowish-white pieces of material (tonsilloliths) that you occasionally cough up or spit out. The tonsilloliths can also cause you to have bad breath. DIAGNOSIS Tonsillitis can be diagnosed through a physical exam. Diagnosis can be confirmed with the results of lab tests, including a throat culture. TREATMENT  The goals of tonsillitis treatment include the reduction of the severity and duration of symptoms and prevention of associated conditions. Symptoms of tonsillitis can be  improved with the use of steroids to reduce the swelling. Tonsillitis caused by bacteria can be treated with antibiotic medicines. Usually, treatment with antibiotic medicines is started before the cause of the tonsillitis is known. However, if it is determined that the cause is not bacterial, antibiotic medicines will not treat the tonsillitis. If attacks of tonsillitis are severe and frequent, your health care provider may recommend surgery to remove the tonsils (tonsillectomy). HOME CARE INSTRUCTIONS   Rest as much as possible and get plenty of sleep.  Drink plenty of fluids. While the throat is very sore, eat soft foods or liquids, such as sherbet, soups, or instant breakfast drinks.  Eat frozen ice pops.  Gargle with a warm or cold liquid to help soothe the throat. Mix 1/4 teaspoon of salt and 1/4 teaspoon of baking soda in 8 oz of water. SEEK MEDICAL CARE IF:   Large, tender lumps develop in your neck.  A rash develops.  A green, yellow-brown, or bloody substance is coughed up.  You are unable to swallow liquids or food for 24 hours.  You notice that only one of the tonsils is swollen. SEEK IMMEDIATE MEDICAL CARE IF:   You develop any new symptoms such as vomiting, severe headache, stiff neck, chest pain, or trouble breathing or swallowing.  You have severe throat pain along with drooling or voice changes.  You have severe pain, unrelieved with recommended medications.  You are unable to fully open the mouth.  You develop redness, swelling, or severe pain anywhere in the neck.  You have a fever. MAKE SURE YOU:   Understand these instructions.  Will watch your condition.  Will get help right  away if you are not doing well or get worse.   This information is not intended to replace advice given to you by your health care provider. Make sure you discuss any questions you have with your health care provider.   Document Released: 07/06/2005 Document Revised: 10/17/2014  Document Reviewed: 03/15/2013 Elsevier Interactive Patient Education [employer].

## 2015-10-26 LAB — CULTURE, GROUP A STREP (THRC)

## 2015-10-31 ENCOUNTER — Encounter (HOSPITAL_COMMUNITY): Payer: Self-pay | Admitting: Emergency Medicine

## 2015-10-31 ENCOUNTER — Emergency Department (HOSPITAL_COMMUNITY)
Admission: EM | Admit: 2015-10-31 | Discharge: 2015-10-31 | Disposition: A | Discharge status: Home / Self Care | Payer: Medicaid Other | Attending: Emergency Medicine | Admitting: Emergency Medicine

## 2015-10-31 DIAGNOSIS — R102 Pelvic and perineal pain: Secondary | ICD-10-CM | POA: Diagnosis not present

## 2015-10-31 DIAGNOSIS — R3915 Urgency of urination: Secondary | ICD-10-CM | POA: Insufficient documentation

## 2015-10-31 DIAGNOSIS — R109 Unspecified abdominal pain: Secondary | ICD-10-CM | POA: Diagnosis present

## 2015-10-31 DIAGNOSIS — Z7951 Long term (current) use of inhaled steroids: Secondary | ICD-10-CM | POA: Insufficient documentation

## 2015-10-31 DIAGNOSIS — Z3202 Encounter for pregnancy test, result negative: Secondary | ICD-10-CM | POA: Diagnosis not present

## 2015-10-31 DIAGNOSIS — N898 Other specified noninflammatory disorders of vagina: Secondary | ICD-10-CM | POA: Diagnosis not present

## 2015-10-31 LAB — WET PREP, GENITAL
CLUE CELLS WET PREP: NONE SEEN
Sperm: NONE SEEN
TRICH WET PREP: NONE SEEN
Yeast Wet Prep HPF POC: NONE SEEN

## 2015-10-31 LAB — URINALYSIS, ROUTINE W REFLEX MICROSCOPIC
BILIRUBIN URINE: NEGATIVE
Glucose, UA: NEGATIVE mg/dL
HGB URINE DIPSTICK: NEGATIVE
KETONES UR: NEGATIVE mg/dL
Leukocytes, UA: NEGATIVE
NITRITE: NEGATIVE
PH: 8.5 — AB (ref 5.0–8.0)
Protein, ur: NEGATIVE mg/dL
Specific Gravity, Urine: 1.021 (ref 1.005–1.030)

## 2015-10-31 LAB — PREGNANCY, URINE: PREG TEST UR: NEGATIVE

## 2015-10-31 LAB — POC URINE PREG, ED: Preg Test, Ur: NEGATIVE

## 2015-10-31 MED ORDER — AZITHROMYCIN 250 MG PO TABS
1000.0000 mg | ORAL_TABLET | Freq: Once | ORAL | Status: AC
  Administered 2015-10-31: 1000 mg via ORAL
  Filled 2015-10-31: qty 4

## 2015-10-31 MED ORDER — CEFTRIAXONE SODIUM 250 MG IJ SOLR
250.0000 mg | Freq: Once | INTRAMUSCULAR | Status: AC
  Administered 2015-10-31: 250 mg via INTRAMUSCULAR
  Filled 2015-10-31: qty 250

## 2015-10-31 MED ORDER — LIDOCAINE HCL (PF) 1 % IJ SOLN
INTRAMUSCULAR | Status: AC
  Administered 2015-10-31: 2 mL
  Filled 2015-10-31: qty 5

## 2015-10-31 NOTE — Discharge Instructions (Signed)
Make sure that you use a condom each time you have sex. Call Dr. Jodi Mourning to arrange to be seen in the office if you're not feeling better by next week.

## 2015-10-31 NOTE — ED Provider Notes (Signed)
CSN: [id number]     Arrival date & time 10/31/15  0901 History   First MD Initiated Contact with Patient 10/31/15 [phone]     Chief Complaint  Patient presents with  . Abdominal Pain     (Consider location/radiation/quality/duration/timing/severity/associated sxs/prior Treatment) HPI Planes of urinary urgency and vague suprapubic discomfort radiating to lower back bilaterally onset 1 week ago. Symptoms accompanied by vaginal discharge. No nausea or vomiting no fever no anorexia. Pain worse with walking. Improved with remaining still No treatment prior to coming here and no other associated symptoms. Last bowel movement yesterday, normal. Last normal menstrual period 10/10/2015 Past Medical History  Diagnosis Date  . No pertinent past medical history   . Medical history non-contributory    Past Surgical History  Procedure Laterality Date  . No past surgeries     Family History  Problem Relation Age of Onset  . Anesthesia problems Neg Hx   . Hypertension Maternal Grandmother   . Hypertension Paternal Grandmother   . Diabetes Paternal Grandmother    Social History  Substance Use Topics  . Smoking status: Never Smoker   . Smokeless tobacco: Never Used  . Alcohol Use: No   OB History    Gravida Para Term Preterm AB TAB SAB Ectopic Multiple Living   3 2 2  1  1   0 2     Review of Systems  Constitutional: Negative.   HENT: Negative.   Respiratory: Negative.   Cardiovascular: Negative.   Gastrointestinal: Positive for abdominal pain.  Genitourinary: Positive for vaginal discharge.  Musculoskeletal: Positive for back pain.  Skin: Negative.   Neurological: Negative.   Psychiatric/Behavioral: Negative.   All other systems reviewed and are negative.     Allergies  Review of patient's allergies indicates no known allergies.  Home Medications   Prior to Admission medications   Medication Sig Start Date End Date Taking? Authorizing Provider  predniSONE (DELTASONE) 20 MG  tablet Take 2 tablets (40 mg total) by mouth daily. 10/23/15   Olivia Canter Nevelyn Mellott, PA-C   BP 113/61 mmHg  Pulse 73  Temp(Src) 97.7 F (36.5 C) (Oral)  Resp 16  SpO2 100%  LMP 10/10/2015 Physical Exam  Constitutional: She is oriented to person, place, and time. She appears well-developed and well-nourished. No distress.  HENT:  Head: Normocephalic and atraumatic.  Eyes: Conjunctivae are normal. Pupils are equal, round, and reactive to light.  Neck: Neck supple. No tracheal deviation present. No thyromegaly present.  Cardiovascular: Normal rate and regular rhythm.   No murmur heard. Pulmonary/Chest: Effort normal and breath sounds normal.  Abdominal: Soft. Bowel sounds are normal. She exhibits no distension. There is no tenderness.  Genitourinary:  No flank tenderness. No external lesion. White discharge in vault. No cervical motion tenderness no adnexal masses or tenderness  Musculoskeletal: Normal range of motion. She exhibits no edema or tenderness.  Neurological: She is alert and oriented to person, place, and time. Coordination normal.  Skin: Skin is warm and dry. No rash noted.  Psychiatric: She has a normal mood and affect.  Nursing note and vitals reviewed.   ED Course  Procedures (including critical care time) Labs Review Labs Reviewed  PREGNANCY, URINE  POC URINE PREG, ED    Imaging Review No results found. I have personally reviewed and evaluated these images and lab results as part of my medical decision-making.   EKG Interpretation None     12:40 PM patient resting comfortably.  Results for orders placed or performed during  the hospital encounter of 10/31/15  Wet prep, genital  Result Value Ref Range   Yeast Wet Prep HPF POC NONE SEEN NONE SEEN   Trich, Wet Prep NONE SEEN NONE SEEN   Clue Cells Wet Prep HPF POC NONE SEEN NONE SEEN   WBC, Wet Prep HPF POC PRESENT (A) NONE SEEN   Sperm NONE SEEN   Pregnancy, urine  Result Value Ref Range   Preg Test, Ur  NEGATIVE NEGATIVE  Urinalysis, Routine w reflex microscopic (not at [facility])  Result Value Ref Range   Color, Urine YELLOW YELLOW   APPearance CLEAR CLEAR   Specific Gravity, Urine 1.021 1.005 - 1.030   pH 8.5 (H) 5.0 - 8.0   Glucose, UA NEGATIVE NEGATIVE mg/dL   Hgb urine dipstick NEGATIVE NEGATIVE   Bilirubin Urine NEGATIVE NEGATIVE   Ketones, ur NEGATIVE NEGATIVE mg/dL   Protein, ur NEGATIVE NEGATIVE mg/dL   Nitrite NEGATIVE NEGATIVE   Leukocytes, UA NEGATIVE NEGATIVE  POC Urine Preg, ED (not at [facility])  Result Value Ref Range   Preg Test, Ur NEGATIVE NEGATIVE   No results found. Hiv, rpr pending MDM  In light of negative urinalysis and vaginal discharge and white cells present on wet prep will treat empirically for sexually transmitted diseases. Rocephin, Zithromax. Patient encouraged to follow-up with Dr. Jodi Mourning if discomfort is not resolved by next week. Safe sex encouraged Final diagnoses:  None   diagnosis pelvic pain      Orlie Dakin, MD 10/31/15 1246

## 2015-10-31 NOTE — ED Notes (Signed)
Lower abdominal pain and lower back pain starting last week. Reports urgency and frequency with urination. Reports white discharge from vagina.

## 2015-11-01 LAB — RPR: RPR Ser Ql: NONREACTIVE

## 2015-11-01 LAB — HIV ANTIBODY (ROUTINE TESTING W REFLEX): HIV SCREEN 4TH GENERATION: NONREACTIVE

## 2015-11-02 LAB — GC/CHLAMYDIA PROBE AMP (~~LOC~~) NOT AT ARMC
Chlamydia: POSITIVE — AB
Neisseria Gonorrhea: NEGATIVE

## 2015-11-03 ENCOUNTER — Telehealth (HOSPITAL_COMMUNITY): Payer: Self-pay
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
## 2016-02-17 ENCOUNTER — Ambulatory Visit (HOSPITAL_COMMUNITY)
Admission: EM | Admit: 2016-02-17 | Discharge: 2016-02-17 | Disposition: A | Discharge status: Home / Self Care | Payer: No Typology Code available for payment source | Source: Ambulatory Visit | Attending: Emergency Medicine | Admitting: Emergency Medicine

## 2016-02-17 ENCOUNTER — Emergency Department (HOSPITAL_COMMUNITY)
Admission: EM | Admit: 2016-02-17 | Discharge: 2016-02-17 | Disposition: A | Discharge status: Other Acute Inpatient Hospital | Payer: Medicaid Other | Attending: Emergency Medicine | Admitting: Emergency Medicine

## 2016-02-17 ENCOUNTER — Encounter (HOSPITAL_COMMUNITY): Payer: Self-pay | Admitting: Emergency Medicine

## 2016-02-17 DIAGNOSIS — Y9289 Other specified places as the place of occurrence of the external cause: Secondary | ICD-10-CM | POA: Insufficient documentation

## 2016-02-17 DIAGNOSIS — Z7952 Long term (current) use of systemic steroids: Secondary | ICD-10-CM | POA: Insufficient documentation

## 2016-02-17 DIAGNOSIS — Y998 Other external cause status: Secondary | ICD-10-CM | POA: Insufficient documentation

## 2016-02-17 DIAGNOSIS — Y9389 Activity, other specified: Secondary | ICD-10-CM | POA: Diagnosis not present

## 2016-02-17 DIAGNOSIS — L309 Dermatitis, unspecified: Secondary | ICD-10-CM | POA: Diagnosis not present

## 2016-02-17 DIAGNOSIS — T7421XA Adult sexual abuse, confirmed, initial encounter: Secondary | ICD-10-CM | POA: Insufficient documentation

## 2016-02-17 DIAGNOSIS — Z3202 Encounter for pregnancy test, result negative: Secondary | ICD-10-CM | POA: Diagnosis not present

## 2016-02-17 DIAGNOSIS — Z0441 Encounter for examination and observation following alleged adult rape: Secondary | ICD-10-CM | POA: Diagnosis present

## 2016-02-17 DIAGNOSIS — T7621XA Adult sexual abuse, suspected, initial encounter: Secondary | ICD-10-CM | POA: Diagnosis present

## 2016-02-17 DIAGNOSIS — S50312A Abrasion of left elbow, initial encounter: Secondary | ICD-10-CM | POA: Insufficient documentation

## 2016-02-17 DIAGNOSIS — S50311A Abrasion of right elbow, initial encounter: Secondary | ICD-10-CM | POA: Insufficient documentation

## 2016-02-17 DIAGNOSIS — T07XXXA Unspecified multiple injuries, initial encounter: Secondary | ICD-10-CM

## 2016-02-17 LAB — I-STAT BETA HCG BLOOD, ED (MC, WL, AP ONLY)

## 2016-02-17 LAB — HIV ANTIBODY (ROUTINE TESTING W REFLEX): HIV Screen 4th Generation wRfx: NONREACTIVE

## 2016-02-17 MED ORDER — LIDOCAINE HCL (PF) 1 % IJ SOLN
INTRAMUSCULAR | Status: AC
  Administered 2016-02-17: 5 mL via INTRAMUSCULAR
  Filled 2016-02-17: qty 5

## 2016-02-17 MED ORDER — CEFTRIAXONE SODIUM 250 MG IJ SOLR
250.0000 mg | Freq: Once | INTRAMUSCULAR | Status: AC
  Administered 2016-02-17: 250 mg via INTRAMUSCULAR
  Filled 2016-02-17: qty 250

## 2016-02-17 MED ORDER — METRONIDAZOLE 500 MG PO TABS
2000.0000 mg | ORAL_TABLET | Freq: Once | ORAL | Status: AC
  Administered 2016-02-17: 2000 mg via ORAL
  Filled 2016-02-17: qty 4

## 2016-02-17 MED ORDER — ONDANSETRON 4 MG PO TBDP
8.0000 mg | ORAL_TABLET | Freq: Once | ORAL | Status: AC
  Administered 2016-02-17: 8 mg via ORAL
  Filled 2016-02-17: qty 2

## 2016-02-17 MED ORDER — ULIPRISTAL ACETATE 30 MG PO TABS
30.0000 mg | ORAL_TABLET | Freq: Once | ORAL | Status: AC
  Administered 2016-02-17: 30 mg via ORAL
  Filled 2016-02-17: qty 1

## 2016-02-17 MED ORDER — AZITHROMYCIN 250 MG PO TABS
1000.0000 mg | ORAL_TABLET | Freq: Once | ORAL | Status: AC
  Administered 2016-02-17: 1000 mg via ORAL
  Filled 2016-02-17: qty 4

## 2016-02-17 MED ORDER — IBUPROFEN 800 MG PO TABS
800.0000 mg | ORAL_TABLET | Freq: Once | ORAL | Status: AC
  Administered 2016-02-17: 800 mg via ORAL
  Filled 2016-02-17: qty 1
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
## 2016-02-17 NOTE — SANE Note (Signed)
Patient tolerated medications.  MD and RN updated and patient discharged with discharge instructions. Verbalized understanding. Obtained letter for school for patient. Referrals sent to [facility].

## 2016-02-17 NOTE — ED Notes (Signed)
Sane RN at bedside with patient.
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
## 2016-02-17 NOTE — ED Provider Notes (Signed)
Seen by sane nurse She requests to be given rocephin/zithromax/flagyll/ibuprofen and [facility] emergency contraception These meds were ordered for patient Pt otherwise stable for exam   Ripley Fraise, MD 02/17/16 [phone]

## 2016-02-18 LAB — HEPATITIS PANEL, ACUTE
HEP B S AG: NEGATIVE
Hep A IgM: NEGATIVE
Hep B C IgM: NEGATIVE

## 2016-03-10 ENCOUNTER — Encounter: Payer: No Typology Code available for payment source | Admitting: Medical

## 2016-08-27 ENCOUNTER — Encounter (HOSPITAL_COMMUNITY): Payer: Self-pay | Admitting: *Deleted

## 2016-08-27 ENCOUNTER — Inpatient Hospital Stay (HOSPITAL_COMMUNITY)
Admission: AD | Admit: 2016-08-27 | Discharge: 2016-08-27 | Disposition: A | Discharge status: Home / Self Care | Payer: Medicaid Other | Source: Ambulatory Visit | Attending: Obstetrics & Gynecology | Admitting: Obstetrics & Gynecology

## 2016-08-27 DIAGNOSIS — N912 Amenorrhea, unspecified: Secondary | ICD-10-CM | POA: Diagnosis present

## 2016-08-27 NOTE — MAU Provider Note (Signed)
Erin Torres is a 25 y.o. [id number] at [redacted]w[redacted]d by unsure LMP who presents to MAU today for pregnancy verification. She wants to know gestational age as she is unsure of LMP and desires termination. The patient denies abdominal pain or vaginal bleeding today.   BP 115/79 (BP Location: Right Arm)   Pulse 117   Temp 98.3 F (36.8 C)   Resp 18   Ht 5\' 4"  (1.626 m)   Wt 52.3 kg (115 lb 3.2 oz)   LMP 06/18/2016   SpO2 100%   BMI 19.77 kg/m   CONSTITUTIONAL: Well-developed, well-nourished female in no acute distress.  ENT: External right and left ear normal.  EYES: EOM intact, conjunctivae normal.  MUSCULOSKELETAL: Normal range of motion.  CARDIOVASCULAR: Regular heart rate RESPIRATORY: Normal effort NEUROLOGICAL: Alert and oriented to person, place, and time.  SKIN: Skin is warm and dry. No rash noted. Not diaphoretic. No erythema. No pallor. PSYCH: Normal mood and affect. Normal behavior. Normal judgment and thought content.  No results found for this or any previous visit (from the past 24 hour(s)).  MDM Patient advised that without concerning symptoms today UPT and dating [place] will not be performed in MAU at this time.   A: Amenorrhea  P: Discharge home Pt advised that routine pregnancy tests are offered in the [facility] Monday- Thursday 8-4 and Friday 8-11 but they do not do routine dating [place]. She may seek care from OB/GYN provider of choice. Patient may return to MAU as needed or if her condition were to change.  Julianne Handler, CNM  08/27/2016 5:01 AM

## 2016-08-27 NOTE — MAU Note (Signed)
Just want to be sure how far along I am because I plan to go to the clinic downstairs. Denies any pain or bleeding. LMP 06/18/16

## 2016-08-27 NOTE — Progress Notes (Signed)
Julianne Handler CNM notified of pt's admission to Triage and request to know how many wks pregnant. CNM will talk with pt in Triage

## 2016-08-27 NOTE — MAU Note (Addendum)
Julianne Handler CNM in to see pt in Triage. Plan of care discussed and pt d/c home from Triage

## 2016-09-04 ENCOUNTER — Encounter (HOSPITAL_COMMUNITY): Payer: Self-pay

## 2016-09-04 ENCOUNTER — Emergency Department (HOSPITAL_COMMUNITY)
Admission: EM | Admit: 2016-09-04 | Discharge: 2016-09-04 | Disposition: A | Discharge status: Home / Self Care | Payer: Medicaid Other | Attending: Emergency Medicine | Admitting: Emergency Medicine

## 2016-09-04 DIAGNOSIS — M545 Low back pain, unspecified: Secondary | ICD-10-CM

## 2016-09-04 DIAGNOSIS — Z3A09 9 weeks gestation of pregnancy: Secondary | ICD-10-CM | POA: Insufficient documentation

## 2016-09-04 DIAGNOSIS — O9989 Other specified diseases and conditions complicating pregnancy, childbirth and the puerperium: Secondary | ICD-10-CM | POA: Diagnosis present

## 2016-09-04 LAB — URINALYSIS, ROUTINE W REFLEX MICROSCOPIC
BILIRUBIN URINE: NEGATIVE
GLUCOSE, UA: 250 mg/dL — AB
Hgb urine dipstick: NEGATIVE
KETONES UR: NEGATIVE mg/dL
Leukocytes, UA: NEGATIVE
NITRITE: NEGATIVE
PH: 6 (ref 5.0–8.0)
Protein, ur: NEGATIVE mg/dL
Specific Gravity, Urine: 1.023 (ref 1.005–1.030)

## 2016-09-04 NOTE — ED Triage Notes (Signed)
Pt states that she is approx 9-[redacted] weeks pregnant and over the last week she has been experiencing back pain and blood spotting. Also endorses increased fatigue. Denies N/V. Ambulatory. A&Ox4.

## 2016-09-04 NOTE — ED Provider Notes (Signed)
[facility] DEPT Provider Note   CSN: [id number] Arrival date & time: 09/04/16  0024 By signing my name below, I, Dyke Brackett, attest that this documentation has been prepared under the direction and in the presence of Deno Etienne, DO . Electronically Signed: Dyke Brackett, Scribe. 09/04/2016. 1:50 AM.   History   Chief Complaint Chief Complaint  Patient presents with  . Back Pain  . Routine Prenatal Visit    HPI Erin Torres is a 25 y.o. female who presents to the Emergency Department complaining of intermittent, gradually worsening lower back pain onset a few weeks ago. Pt describes the pain as aching and severe. No alleviating or modifying factors noted. Pt notes associated increased fatigue, abdominal cramping, and some bloody vaginal discharge which has since resolved. Pt is currently [redacted] weeks pregnant. She is not currently followed by an OBGYN. She denies any nausea or vomiting.  The history is provided by the patient. No language interpreter was used.   Past Medical History:  Diagnosis Date  . Medical history non-contributory   . No pertinent past medical history     Patient Active Problem List   Diagnosis Date Noted  . Pregnancy 06/16/2015  . NSVD (normal spontaneous vaginal delivery) 06/16/2015  . Hx of migraines 12/17/2014    Past Surgical History:  Procedure Laterality Date  . NO PAST SURGERIES      OB History    Gravida Para Term Preterm AB Living   4 2 2   1 2    SAB TAB Ectopic Multiple Live Births   1     0 2      Home Medications    Prior to Admission medications   Medication Sig Start Date End Date Taking? Authorizing Provider  predniSONE (DELTASONE) 20 MG tablet Take 2 tablets (40 mg total) by mouth daily. 10/23/15   Anne Ng, PA-C    Family History Family History  Problem Relation Age of Onset  . Hypertension Maternal Grandmother   . Hypertension Paternal Grandmother   . Diabetes Paternal Grandmother   . Anesthesia problems  Neg Hx     Social History Social History  Substance Use Topics  . Smoking status: Never Smoker  . Smokeless tobacco: Never Used  . Alcohol use No    Allergies   Patient has no known allergies.  Review of Systems Review of Systems  Constitutional: Positive for fatigue. Negative for chills and fever.  HENT: Negative for congestion and rhinorrhea.   Eyes: Negative for redness and visual disturbance.  Respiratory: Negative for shortness of breath and wheezing.   Cardiovascular: Negative for chest pain and palpitations.  Gastrointestinal: Positive for abdominal pain. Negative for nausea and vomiting.  Genitourinary: Positive for vaginal bleeding and vaginal discharge. Negative for dysuria and urgency.  Musculoskeletal: Positive for back pain. Negative for arthralgias and myalgias.  Skin: Negative for pallor and wound.  Neurological: Negative for dizziness and headaches.   Physical Exam Updated Vital Signs BP 110/65   Pulse 66   Temp 98.2 F (36.8 C) (Oral)   Resp 18   Ht 5\' 4"  (1.626 m)   Wt 115 lb (52.2 kg)   LMP 06/25/2016 (Within Days)   SpO2 99%   BMI 19.74 kg/m   Physical Exam  Constitutional: She is oriented to person, place, and time. She appears well-developed and well-nourished. No distress.  HENT:  Head: Normocephalic and atraumatic.  Eyes: EOM are normal. Pupils are equal, round, and reactive to light.  Neck: Normal range  of motion. Neck supple.  Cardiovascular: Normal rate and regular rhythm.  Exam reveals no gallop and no friction rub.   No murmur heard. Pulmonary/Chest: Effort normal. She has no wheezes. She has no rales.  Abdominal: Soft. She exhibits no distension. There is no tenderness.  Musculoskeletal: She exhibits no edema or tenderness.  No C-spine tenderness. Unable to reproduce back pain. Negative straight leg raise test bilaterally.   Neurological: She is alert and oriented to person, place, and time.  Skin: Skin is warm and dry. She is not  diaphoretic.  Psychiatric: She has a normal mood and affect. Her behavior is normal.  Nursing note and vitals reviewed.  ED Treatments / Results  DIAGNOSTIC STUDIES:  Oxygen Saturation is 100% on RA, normal by my interpretation.    COORDINATION OF CARE:  1:50 AM Discussed treatment plan with pt at bedside and pt agreed to plan.   Labs (all labs ordered are listed, but only abnormal results are displayed) Labs Reviewed  URINALYSIS, ROUTINE W REFLEX MICROSCOPIC (NOT AT [facility]) - Abnormal; Notable for the following:       Result Value   APPearance CLOUDY (*)    Glucose, UA 250 (*)    All other components within normal limits    EKG  EKG Interpretation None       Radiology No results found.  Procedures Procedures (including critical care time)  Medications Ordered in ED Medications - No data to display   Initial Impression / Assessment and Plan / ED Course  I have reviewed the triage vital signs and the nursing notes.  Pertinent labs & imaging results that were available during my care of the patient were reviewed by me and considered in my medical decision making (see chart for details).  Clinical Course     25 yo F With chief complaints of low back pain. This been going on for the past week or so. Patient also had one day of spotting. This is resolved. Denies any cramps. Has not yet seen an obstetrician. I discussed at length with the patient that she needs to follow-up with OB.Marland KitchenHaving enough time is not a excuse. Discussed use of Tylenol during pregnancy. UA negative for infection. OB follow-up.  3:10 AM:  I have discussed the diagnosis/risks/treatment options with the patient and family and believe the pt to be eligible for discharge home to follow-up with PCP. We also discussed returning to the ED immediately if new or worsening sx occur. We discussed the sx which are most concerning (e.g., sudden worsening pain, fever, inability to tolerate by mouth) that necessitate  immediate return. Medications administered to the patient during their visit and any new prescriptions provided to the patient are listed below.  Medications given during this visit Medications - No data to display   The patient appears reasonably screen and/or stabilized for discharge and I doubt any other medical condition or other [facility] requiring further screening, evaluation, or treatment in the ED at this time prior to discharge.    Final Clinical Impressions(s) / ED Diagnoses   Final diagnoses:  Acute bilateral low back pain without sciatica    New Prescriptions Discharge Medication List as of 09/04/2016  1:52 AM     I personally performed the services described in this documentation, which was scribed in my presence. The recorded information has been reviewed and is accurate.     Deno Etienne, DO 09/04/16 [id number]

## 2016-09-04 NOTE — Discharge Instructions (Signed)
Tylenol for your pain, you need to follow up with OB.

## 2016-10-10 NOTE — L&D Delivery Note (Signed)
26 y.o. [id number] at [redacted]w[redacted]d delivered a viable female infant in cephalic, ROA position. No nuchal cord. Left anterior shoulder delivered with ease. 60 sec delayed cord clamping. Cord clamped x2 and cut. Placenta delivered spontaneously intact, with 3VC. Fundus firm on exam with massage and pitocin. Good hemostasis noted.  Anesthesia: None Laceration: None Suture: N/A Good hemostasis noted. EBL: 100cc  Mom and baby recovering in LDR.    Apgars: APGAR (1 MIN): 9  APGAR (5 MINS):  9   Weight: Pending skin to skin   Sponge and instrument count were correct x2. Placenta sent to L&D.  Katherine Basset, DO [facility] [employer] 04/01/2017, 7:41 PM

## 2016-10-30 LAB — OB RESULTS CONSOLE GC/CHLAMYDIA: GC PROBE AMP, GENITAL: NEGATIVE

## 2017-01-05 LAB — OB RESULTS CONSOLE GC/CHLAMYDIA
Chlamydia: NEGATIVE
Gonorrhea: NEGATIVE

## 2017-01-05 LAB — OB RESULTS CONSOLE RUBELLA ANTIBODY, IGM: Rubella: IMMUNE

## 2017-01-05 LAB — OB RESULTS CONSOLE RPR: RPR: NONREACTIVE

## 2017-01-05 LAB — OB RESULTS CONSOLE HEPATITIS B SURFACE ANTIGEN: HEP B S AG: NEGATIVE

## 2017-02-16 LAB — OB RESULTS CONSOLE HIV ANTIBODY (ROUTINE TESTING): HIV: NONREACTIVE

## 2017-03-01 LAB — OB RESULTS CONSOLE GBS: STREP GROUP B AG: NEGATIVE

## 2017-03-22 ENCOUNTER — Inpatient Hospital Stay (HOSPITAL_COMMUNITY)
Admission: AD | Admit: 2017-03-22 | Discharge: 2017-03-22 | Disposition: A | Discharge status: Home / Self Care | Payer: Medicaid Other | Source: Ambulatory Visit | Attending: Obstetrics & Gynecology | Admitting: Obstetrics & Gynecology

## 2017-03-22 ENCOUNTER — Encounter (HOSPITAL_COMMUNITY): Payer: Self-pay

## 2017-03-22 DIAGNOSIS — Z3A00 Weeks of gestation of pregnancy not specified: Secondary | ICD-10-CM | POA: Insufficient documentation

## 2017-03-22 DIAGNOSIS — O479 False labor, unspecified: Secondary | ICD-10-CM

## 2017-03-22 NOTE — MAU Note (Signed)
I have communicated with Kerry Hough PA and reviewed vital signs:  Vitals:   03/22/17 1534 03/22/17 1604  BP: (!) 106/57 110/67  Pulse: 83 72  Resp: 16 18  Temp: 98 F (36.7 C)     Vaginal exam:  Dilation: 1 Effacement (%): Thick Cervical Position: Posterior Station: -3 Presentation: Vertex Exam by:: Blair Hailey RN ,   Also reviewed contraction pattern and that non-stress test is reactive.  It has been documented that patient is not contracting over an hour not indicating active labor.  Patient denies any other complaints.  Based on this report provider has given order for discharge.  A discharge order and diagnosis entered by a provider.   Labor discharge instructions reviewed with patient.

## 2017-03-22 NOTE — Discharge Instructions (Signed)
Braxton Hicks Contractions °Contractions of the uterus can occur throughout pregnancy, but they are not always a sign that you are in labor. You may have practice contractions called Braxton Hicks contractions. These false labor contractions are sometimes confused with true labor. °What are Braxton Hicks contractions? °Braxton Hicks contractions are tightening movements that occur in the muscles of the uterus before labor. Unlike true labor contractions, these contractions do not result in opening (dilation) and thinning of the cervix. Toward the end of pregnancy (32-34 weeks), Braxton Hicks contractions can happen more often and may become stronger. These contractions are sometimes difficult to tell apart from true labor because they can be very uncomfortable. You should not feel embarrassed if you go to the hospital with false labor. °Sometimes, the only way to tell if you are in true labor is for your health care provider to look for changes in the cervix. The health care provider will do a physical exam and may monitor your contractions. If you are not in true labor, the exam should show that your cervix is not dilating and your water has not broken. °If there are no prenatal problems or other health problems associated with your pregnancy, it is completely safe for you to be sent home with false labor. You may continue to have Braxton Hicks contractions until you go into true labor. °How can I tell the difference between true labor and false labor? °· Differences °? False labor °? Contractions last 30-70 seconds.: Contractions are usually shorter and not as strong as true labor contractions. °? Contractions become very regular.: Contractions are usually irregular. °? Discomfort is usually felt in the top of the uterus, and it spreads to the lower abdomen and low back.: Contractions are often felt in the front of the lower abdomen and in the groin. °? Contractions do not go away with walking.: Contractions may  go away when you walk around or change positions while lying down. °? Contractions usually become more intense and increase in frequency.: Contractions get weaker and are shorter-lasting as time goes on. °? The cervix dilates and gets thinner.: The cervix usually does not dilate or become thin. °Follow these instructions at home: °· Take over-the-counter and prescription medicines only as told by your health care provider. °· Keep up with your usual exercises and follow other instructions from your health care provider. °· Eat and drink lightly if you think you are going into labor. °· If Braxton Hicks contractions are making you uncomfortable: °? Change your position from lying down or resting to walking, or change from walking to resting. °? Sit and rest in a tub of warm water. °? Drink enough fluid to keep your urine clear or pale yellow. Dehydration may cause these contractions. °? Do slow and deep breathing several times an hour. °· Keep all follow-up prenatal visits as told by your health care provider. This is important. °Contact a health care provider if: °· You have a fever. °· You have continuous pain in your abdomen. °Get help right away if: °· Your contractions become stronger, more regular, and closer together. °· You have fluid leaking or gushing from your vagina. °· You pass blood-tinged mucus (bloody show). °· You have bleeding from your vagina. °· You have low back pain that you never had before. °· You feel your baby’s head pushing down and causing pelvic pressure. °· Your baby is not moving inside you as much as it used to. °Summary °· Contractions that occur before labor are   called Braxton Hicks contractions, false labor, or practice contractions. °· Braxton Hicks contractions are usually shorter, weaker, farther apart, and less regular than true labor contractions. True labor contractions usually become progressively stronger and regular and they become more frequent. °· Manage discomfort from  Braxton Hicks contractions by changing position, resting in a warm bath, drinking plenty of water, or practicing deep breathing. °This information is not intended to replace advice given to you by your health care provider. Make sure you discuss any questions you have with your health care provider. °Document Released: 09/26/2005 Document Revised: 08/15/2016 Document Reviewed: 08/15/2016 °Elsevier Interactive Patient Education © 2017 Else[employer] Inc[employer] ° ° °Fetal Movement Counts °Patient Name: ________________________________________________ Patient Due Date: ____________________ °What is a fetal movement count? °A fetal movement count is the number of times that you feel your baby move during a certain amount of time. This may also be called a fetal kick count. A fetal movement count is recommended for every pregnant woman. You may be asked to start counting fetal movements as early as week 28 of your pregnancy. °Pay attention to when your baby is most active. You may notice your baby's sleep and wake cycles. You may also notice things that make your baby move more. You should do a fetal movement count: °· When your baby is normally most active. °· At the same time each day. ° °A good time to count movements is while you are resting, after having something to eat and drink. °How do I count fetal movements? °1. Find a quiet, comfortable area. Sit, or lie down on your side. °2. Write down the date, the start time and stop time, and the number of movements that you felt between those two times. Take this information with you to your health care visits. °3. For 2 hours, count kicks, flutters, swishes, rolls, and jabs. You should feel at least 10 movements during 2 hours. °4. You may stop counting after you have felt 10 movements. °5. If you do not feel 10 movements in 2 hours, have something to eat and drink. Then, keep resting and counting for 1 hour. If you feel at least 4 movements during that hour, you may stop  counting. °Contact a health care provider if: °· You feel fewer than 4 movements in 2 hours. °· Your baby is not moving like he or she usually does. °Date: ____________ Start time: ____________ Stop time: ____________ Movements: ____________ °Date: ____________ Start time: ____________ Stop time: ____________ Movements: ____________ °Date: ____________ Start time: ____________ Stop time: ____________ Movements: ____________ °Date: ____________ Start time: ____________ Stop time: ____________ Movements: ____________ °Date: ____________ Start time: ____________ Stop time: ____________ Movements: ____________ °Date: ____________ Start time: ____________ Stop time: ____________ Movements: ____________ °Date: ____________ Start time: ____________ Stop time: ____________ Movements: ____________ °Date: ____________ Start time: ____________ Stop time: ____________ Movements: ____________ °Date: ____________ Start time: ____________ Stop time: ____________ Movements: ____________ °This information is not intended to replace advice given to you by your health care provider. Make sure you discuss any questions you have with your health care provider. °Document Released: 10/26/2006 Document Revised: 05/25/2016 Document Reviewed: 11/05/2015 °Elsevier Interactive Patient Education © 2018 Elsevier Inc. ° °

## 2017-03-22 NOTE — MAU Note (Signed)
Mucous plug fell out a day or two ago, has been having contractions, will have periods where they are regular.  Doesn't know if her cervix is changing.  No bleeding or leaking.

## 2017-03-30 ENCOUNTER — Telehealth (HOSPITAL_COMMUNITY): Payer: Self-pay | Admitting: *Deleted

## 2017-03-30 NOTE — Telephone Encounter (Signed)
Preadmission screen  

## 2017-03-31 ENCOUNTER — Telehealth (HOSPITAL_COMMUNITY): Payer: Self-pay | Admitting: *Deleted

## 2017-03-31 ENCOUNTER — Other Ambulatory Visit: Payer: Self-pay | Admitting: Advanced Practice Midwife

## 2017-03-31 NOTE — Telephone Encounter (Signed)
Preadmission screen  

## 2017-04-01 ENCOUNTER — Inpatient Hospital Stay (HOSPITAL_COMMUNITY)
Admission: RE | Admit: 2017-04-01 | Discharge: 2017-04-03 | DRG: 775 | Disposition: A | Discharge status: Home / Self Care | Payer: Medicaid Other | Source: Ambulatory Visit | Attending: Obstetrics and Gynecology | Admitting: Obstetrics and Gynecology

## 2017-04-01 ENCOUNTER — Encounter (HOSPITAL_COMMUNITY): Payer: Self-pay

## 2017-04-01 DIAGNOSIS — O480 Post-term pregnancy: Secondary | ICD-10-CM | POA: Diagnosis not present

## 2017-04-01 DIAGNOSIS — Z3A41 41 weeks gestation of pregnancy: Secondary | ICD-10-CM

## 2017-04-01 LAB — CBC
HCT: 28.5 % — ABNORMAL LOW (ref 36.0–46.0)
Hemoglobin: 9.6 g/dL — ABNORMAL LOW (ref 12.0–15.0)
MCH: 29.4 pg (ref 26.0–34.0)
MCHC: 33.7 g/dL (ref 30.0–36.0)
MCV: 87.4 fL (ref 78.0–100.0)
PLATELETS: 261 10*3/uL (ref 150–400)
RBC: 3.26 MIL/uL — ABNORMAL LOW (ref 3.87–5.11)
RDW: 14.5 % (ref 11.5–15.5)
WBC: 5.8 10*3/uL (ref 4.0–10.5)

## 2017-04-01 LAB — TYPE AND SCREEN
ABO/RH(D): A POS
ANTIBODY SCREEN: NEGATIVE

## 2017-04-01 MED ORDER — DIBUCAINE 1 % RE OINT: 1.0000 "application " | TOPICAL_OINTMENT | RECTAL | Status: DC | PRN

## 2017-04-01 MED ORDER — ACETAMINOPHEN 325 MG PO TABS: 650.0000 mg | ORAL_TABLET | ORAL | Status: DC | PRN

## 2017-04-01 MED ORDER — ZOLPIDEM TARTRATE 5 MG PO TABS: 5.0000 mg | ORAL_TABLET | Freq: Every evening | ORAL | Status: DC | PRN

## 2017-04-01 MED ORDER — OXYTOCIN 40 UNITS IN LACTATED RINGERS INFUSION - SIMPLE MED
1.0000 m[IU]/min | INTRAVENOUS | Status: DC
  Administered 2017-04-01: 2 m[IU]/min via INTRAVENOUS

## 2017-04-01 MED ORDER — SODIUM CHLORIDE 0.9% FLUSH: 3.0000 mL | Freq: Two times a day (BID) | INTRAVENOUS | Status: DC

## 2017-04-01 MED ORDER — IBUPROFEN 600 MG PO TABS
600.0000 mg | ORAL_TABLET | Freq: Four times a day (QID) | ORAL | Status: DC
  Administered 2017-04-01 – 2017-04-03 (×6): 600 mg via ORAL
  Filled 2017-04-01 (×6): qty 1

## 2017-04-01 MED ORDER — OXYCODONE-ACETAMINOPHEN 5-325 MG PO TABS: 1.0000 | ORAL_TABLET | ORAL | Status: DC | PRN

## 2017-04-01 MED ORDER — ONDANSETRON HCL 4 MG/2ML IJ SOLN: 4.0000 mg | Freq: Four times a day (QID) | INTRAMUSCULAR | Status: DC | PRN

## 2017-04-01 MED ORDER — TETANUS-DIPHTH-ACELL PERTUSSIS 5-2.5-18.5 LF-MCG/0.5 IM SUSP: 0.5000 mL | Freq: Once | INTRAMUSCULAR | Status: DC

## 2017-04-01 MED ORDER — LACTATED RINGERS IV SOLN: 500.0000 mL | INTRAVENOUS | Status: DC | PRN

## 2017-04-01 MED ORDER — FENTANYL CITRATE (PF) 100 MCG/2ML IJ SOLN
100.0000 ug | INTRAMUSCULAR | Status: DC | PRN
  Administered 2017-04-01: 100 ug via INTRAVENOUS
  Filled 2017-04-01: qty 2

## 2017-04-01 MED ORDER — BENZOCAINE-MENTHOL 20-0.5 % EX AERO: 1.0000 "application " | INHALATION_SPRAY | CUTANEOUS | Status: DC | PRN

## 2017-04-01 MED ORDER — SOD CITRATE-CITRIC ACID 500-334 MG/5ML PO SOLN: 30.0000 mL | ORAL | Status: DC | PRN

## 2017-04-01 MED ORDER — ONDANSETRON HCL 4 MG/2ML IJ SOLN: 4.0000 mg | INTRAMUSCULAR | Status: DC | PRN

## 2017-04-01 MED ORDER — OXYCODONE-ACETAMINOPHEN 5-325 MG PO TABS: 2.0000 | ORAL_TABLET | ORAL | Status: DC | PRN

## 2017-04-01 MED ORDER — LACTATED RINGERS IV SOLN
INTRAVENOUS | Status: DC
  Administered 2017-04-01 (×2): via INTRAVENOUS

## 2017-04-01 MED ORDER — LIDOCAINE HCL (PF) 1 % IJ SOLN
30.0000 mL | INTRAMUSCULAR | Status: DC | PRN
  Filled 2017-04-01: qty 30

## 2017-04-01 MED ORDER — SENNOSIDES-DOCUSATE SODIUM 8.6-50 MG PO TABS
2.0000 | ORAL_TABLET | ORAL | Status: DC
  Administered 2017-04-01 – 2017-04-02 (×2): 2 via ORAL
  Filled 2017-04-01 (×2): qty 2

## 2017-04-01 MED ORDER — OXYTOCIN BOLUS FROM INFUSION
500.0000 mL | Freq: Once | INTRAVENOUS | Status: AC
  Administered 2017-04-01: 500 mL via INTRAVENOUS

## 2017-04-01 MED ORDER — COCONUT OIL OIL: 1.0000 "application " | TOPICAL_OIL | Status: DC | PRN

## 2017-04-01 MED ORDER — SODIUM CHLORIDE 0.9% FLUSH: 3.0000 mL | INTRAVENOUS | Status: DC | PRN

## 2017-04-01 MED ORDER — OXYTOCIN 40 UNITS IN LACTATED RINGERS INFUSION - SIMPLE MED
2.5000 [IU]/h | INTRAVENOUS | Status: DC
  Filled 2017-04-01: qty 1000

## 2017-04-01 MED ORDER — SIMETHICONE 80 MG PO CHEW: 80.0000 mg | CHEWABLE_TABLET | ORAL | Status: DC | PRN

## 2017-04-01 MED ORDER — PRENATAL MULTIVITAMIN CH
1.0000 | ORAL_TABLET | Freq: Every day | ORAL | Status: DC
  Administered 2017-04-02: 1 via ORAL
  Filled 2017-04-01: qty 1

## 2017-04-01 MED ORDER — ONDANSETRON HCL 4 MG PO TABS
4.0000 mg | ORAL_TABLET | ORAL | Status: DC | PRN
Start: 2017-04-01 — End: 2017-04-03

## 2017-04-01 MED ORDER — OXYTOCIN 40 UNITS IN LACTATED RINGERS INFUSION - SIMPLE MED: 2.5000 [IU]/h | INTRAVENOUS | Status: DC | PRN

## 2017-04-01 MED ORDER — SODIUM CHLORIDE 0.9 % IV SOLN: 250.0000 mL | INTRAVENOUS | Status: DC | PRN

## 2017-04-01 MED ORDER — WITCH HAZEL-GLYCERIN EX PADS: 1.0000 "application " | MEDICATED_PAD | CUTANEOUS | Status: DC | PRN

## 2017-04-01 MED ORDER — DIPHENHYDRAMINE HCL 25 MG PO CAPS: 25.0000 mg | ORAL_CAPSULE | Freq: Four times a day (QID) | ORAL | Status: DC | PRN

## 2017-04-01 NOTE — Progress Notes (Signed)
CSW acknowledges consult for [facility]. Upon chart review, patient started care on 12/22/2016, gestation 27 weeks and 4 days. MOB then had next OB apt on 01/05/2017, gestation 29 weeks and 3 days. Referral is screened out due to [facility] receiving PNC earlier than 28 weeks and 1 day which is considered LPNC. Please contact clinical social worker if needs arise or upon MOB request.   Oda Cogan, MSW, [facility]  Office: [phone]

## 2017-04-01 NOTE — Anesthesia Pain Management Evaluation Note (Signed)
  CRNA Pain Management Visit Note  Patient: Erin Torres, 26 y.o., female  "Hello I am a member of the anesthesia team at [facility]. We have an anesthesia team available at all times to provide care throughout the hospital, including epidural management and anesthesia for C-section. I don't know your plan for the delivery whether it a natural birth, water birth, IV sedation, nitrous supplementation, doula or epidural, but we want to meet your pain goals."   1.Was your pain managed to your expectations on prior hospitalizations?   Yes   2.What is your expectation for pain management during this hospitalization?     IV pain meds  3.How can we help you reach that goal? IV pain meds. Patient is aware of all pain control options.  Record the patient's initial score and the patient's pain goal.   Pain: 6  Pain Goal: Patient unable to give a number. Patient comfortable at this time, and will ask for pain meds when needed. The [facility] wants you to be able to say your pain was always managed very well.  Lisia Westbay L 04/01/2017

## 2017-04-01 NOTE — Progress Notes (Signed)
Patient ID: Erin Torres, female   DOB: 01/12/1991, 26 y.o.   MRN: [id number]  S: Patient seen & examined for progress of labor. Patient comfortable in room, working on birthing ball.    O:  Vitals:   04/01/17 1201 04/01/17 1433 04/01/17 1531 04/01/17 1831  BP: 116/67     Pulse: 66     Resp:   16 18  Temp:   98.2 F (36.8 C) 98.3 F (36.8 C)  TempSrc:   Oral Oral  Weight:  138 lb (62.6 kg)    Height:  5\' 6"  (1.676 m)      Dilation: 4.5 Effacement (%): 80 Cervical Position: Middle Station: -1 Presentation: Vertex Exam by:: dr Vanetta Shawl   FHT: 130bpm, mod var, +accels, no decels TOCO: q67min  AROM performed, mod clear fluid return  A/P: AROM performed Continue pitocin Continue expectant management Anticipate SVD

## 2017-04-01 NOTE — H&P (Signed)
LABOR AND DELIVERY ADMISSION HISTORY AND PHYSICAL NOTE  TABRINA ESTY is a 26 y.o. female [phone] with IUP at [redacted]w[redacted]d by 57 wk [place] presenting for post dates IOL.   She reports positive fetal movement. She denies leakage of fluid or vaginal bleeding.  Prenatal History/Complications:  Past Medical History: Past Medical History:  Diagnosis Date  . Medical history non-contributory   . No pertinent past medical history     Past Surgical History: Past Surgical History:  Procedure Laterality Date  . NO PAST SURGERIES      Obstetrical History: OB History    Gravida Para Term Preterm AB Living   4 2 2   1 2    SAB TAB Ectopic Multiple Live Births   1     0 2      Social History: Social History   Social History  . Marital status: Single    Spouse name: N/A  . Number of children: N/A  . Years of education: N/A   Social History Main Topics  . Smoking status: Never Smoker  . Smokeless tobacco: Never Used  . Alcohol use No  . Drug use: No  . Sexual activity: Yes    Partners: Male    Birth control/ protection: None   Other Topics Concern  . Not on file   Social History Narrative  . No narrative on file    Family History: Family History  Problem Relation Age of Onset  . Hypertension Maternal Grandmother   . Hypertension Paternal Grandmother   . Diabetes Paternal Grandmother   . Anesthesia problems Neg Hx     Allergies: No Known Allergies  Prescriptions Prior to Admission  Medication Sig Dispense Refill Last Dose  . Prenatal Vit-Fe Fumarate-FA (PRENATAL MULTIVITAMIN) TABS tablet Take 1 tablet by mouth daily at 12 noon.   03/31/2017 at Unknown time     Review of Systems   All systems reviewed and negative except as stated in HPI  Physical Exam Blood pressure 116/67, pulse 66, temperature 98.2 F (36.8 C), temperature source Oral, resp. rate 16, height 5\' 6"  (1.676 m), weight 138 lb (62.6 kg), last menstrual period 06/25/2016, currently  breastfeeding. General appearance: alert, cooperative, appears stated age and no distress Lungs: clear to auscultation bilaterally Heart: regular rate and rhythm Abdomen: soft, non-tender; bowel sounds normal Extremities: No calf swelling or tenderness Presentation: cephalic Fetal monitoring: 125 bpm, mod var, +accels, no decels Uterine activity: Occasional SVE: 3/50/-3, vertex  Prenatal labs: ABO, Rh: --/--/A POS (06/23 3662) Antibody: NEG (06/23 0811) Rubella: !Error! IMMUNE RPR: Nonreactive (03/29 0000)  HBsAg: Negative (03/29 0000)  HIV: Non-reactive (05/10 0000)  GBS: Negative (05/23 0000)  1 hr Glucola: 83 - WNL Genetic screening:  Too late Anatomy US: Normal  Prenatal Transfer Tool  Maternal Diabetes: No Genetic Screening: Declined Maternal Ultrasounds/Referrals: Normal Fetal Ultrasounds or other Referrals:  None Maternal Substance Abuse:  No Significant Maternal Medications:  None Significant Maternal Lab Results: Lab values include: Group B Strep negative  Results for orders placed or performed during the hospital encounter of 04/01/17 (from the past 24 hour(s))  CBC   Collection Time: 04/01/17  8:11 AM  Result Value Ref Range   WBC 5.8 4.0 - 10.5 K/uL   RBC 3.26 (L) 3.87 - 5.11 MIL/uL   Hemoglobin 9.6 (L) 12.0 - 15.0 g/dL   HCT 28.5 (L) 36.0 - 46.0 %   MCV 87.4 78.0 - 100.0 fL   MCH 29.4 26.0 - 34.0 pg  MCHC 33.7 30.0 - 36.0 g/dL   RDW 14.5 11.5 - 15.5 %   Platelets 261 150 - 400 K/uL  Type and screen   Collection Time: 04/01/17  8:11 AM  Result Value Ref Range   ABO/RH(D) A POS    Antibody Screen NEG    Sample Expiration 04/04/2017     Patient Active Problem List   Diagnosis Date Noted  . Post term pregnancy at [redacted] weeks gestation 04/01/2017  . Pregnancy 06/16/2015  . NSVD (normal spontaneous vaginal delivery) 06/16/2015  . Hx of migraines 12/17/2014    Assessment: TOYOKO SILOS is a 26 y.o. [id number] at [redacted]w[redacted]d here for post dates  IOL.  #Labor: IOL with pitocin #Pain: Epidural when requested, but prefers none #FWB:  Cat I #ID:  GBS Neg #MOF:  breast #MOC: Depo #Circ:  Inpatient if insurance allows  Katherine Basset, DO [place] [facility], [facility] 04/01/2017, 6:29 PM

## 2017-04-01 NOTE — Progress Notes (Signed)
Changed Mother-Baby armbands due to baby's pediatrician being changed from [facility] to [facility].

## 2017-04-02 ENCOUNTER — Encounter (HOSPITAL_COMMUNITY): Payer: Self-pay

## 2017-04-02 ENCOUNTER — Inpatient Hospital Stay (HOSPITAL_COMMUNITY): Admission: RE | Admit: 2017-04-02 | Payer: Medicaid Other | Source: Ambulatory Visit

## 2017-04-02 LAB — RPR: RPR: NONREACTIVE

## 2017-04-02 NOTE — Progress Notes (Signed)
POSTPARTUM PROGRESS NOTE  Post Partum Day #1 Subjective:  Erin Torres is a 26 y.o. [id number] [redacted]w[redacted]d s/p SVD.  No acute events overnight.  Pt denies problems with ambulating, voiding or po intake.  She denies nausea or vomiting.  Pain is moderately controlled.  She has had flatus. She has not had bowel movement.  Lochia Small.   Objective: Blood pressure (!) 104/54, pulse 70, temperature 98.2 F (36.8 C), temperature source Oral, resp. rate 18, height 5\' 6"  (1.676 m), weight 138 lb (62.6 kg), last menstrual period 06/25/2016, SpO2 100 %, unknown if currently breastfeeding.  Physical Exam:  General: alert, cooperative and no distress Lochia:normal flow Chest: no respiratory distress Heart:regular rate, distal pulses intact Abdomen: soft, nontender,  Uterine Fundus: firm, appropriately tender DVT Evaluation: No calf swelling or tenderness Extremities: trace edema   Recent Labs  04/01/17 0811  HGB 9.6*  HCT 28.5*    Assessment/Plan:  ASSESSMENT: Erin Torres is a 26 y.o. N3Y0511 [redacted]w[redacted]d s/p SVD  Plan for discharge tomorrow   LOS: 1 day   Brayton Mars 04/02/2017, 8:49 AM

## 2017-04-02 NOTE — Lactation Note (Signed)
This note was copied from a baby's chart. Lactation Consultation Note  P3, Ex Bf.  Knows how to hand express. Latched baby in cradle hold.  Intermittent sucks and swallows observed. Encouraged depth and compress breast to keep baby active. Mom encouraged to feed baby 8-12 times/24 hours and with feeding cues.  Discussed basics, cluster feeding. Mom made aware of O/P services, breastfeeding support groups, community resources, and our phone # for post-discharge questions.  No concerns or questions at this time.  Patient Name: Erin Torres AESLP'N Date: 04/02/2017 Reason for consult: Initial assessment   Maternal Data Has patient been taught Hand Expression?: Yes Does the patient have breastfeeding experience prior to this delivery?: Yes  Feeding Feeding Type: Breast Fed Length of feed: 10 min  LATCH Score/Interventions Latch: Grasps breast easily, tongue down, lips flanged, rhythmical sucking.  Audible Swallowing: A few with stimulation  Type of Nipple: Everted at rest and after stimulation  Comfort (Breast/Nipple): Soft / non-tender     Hold (Positioning): No assistance needed to correctly position infant at breast.  LATCH Score: 9  Lactation Tools Discussed/Used     Consult Status Consult Status: Follow-up Date: 04/03/17 Follow-up type: In-patient    Vivianne Master [facility] 04/02/2017, 9:02 PM

## 2017-04-03 MED ORDER — FERROUS SULFATE 325 (65 FE) MG PO TABS: 325.0000 mg | ORAL_TABLET | Freq: Every day | ORAL | 0 refills | Status: DC

## 2017-04-03 MED ORDER — IBUPROFEN 600 MG PO TABS: 600.0000 mg | ORAL_TABLET | Freq: Four times a day (QID) | ORAL | 0 refills | Status: DC

## 2017-04-03 MED ORDER — SENNOSIDES-DOCUSATE SODIUM 8.6-50 MG PO TABS: 2.0000 | ORAL_TABLET | ORAL | 0 refills | Status: DC

## 2017-04-03 NOTE — Progress Notes (Signed)
UR chart review completed.  

## 2017-04-03 NOTE — Discharge Instructions (Signed)

## 2017-04-03 NOTE — Discharge Summary (Signed)
OB Discharge Summary     Patient Name: Erin Torres DOB: [birth year] MRN: [id number]  Date of admission: 04/01/2017 Delivering MD: Katherine Basset [facility]   Date of discharge: 04/03/2017  Admitting diagnosis: 42WKS INDUCTION  Intrauterine pregnancy: [redacted]w[redacted]d     Secondary diagnosis:  Active Problems:   Post term pregnancy at [redacted] weeks gestation  Additional problems:  Patient Active Problem List   Diagnosis Date Noted  . Post term pregnancy at [redacted] weeks gestation 04/01/2017  . Pregnancy 06/16/2015  . NSVD (normal spontaneous vaginal delivery) 06/16/2015  . Hx of migraines 12/17/2014        Discharge diagnosis: Term Pregnancy Delivered                                                                                                Post partum procedures:none  Augmentation: pitocin  Complications: None  Hospital course:  Onset of Labor With Vaginal Delivery     26 y.o. yo [id number] at [redacted]w[redacted]d was admitted for induction of labor for postdates on 04/01/2017. Patient had an uncomplicated labor course as follows:  Membrane Rupture Time/Date: 6:23 PM ,04/01/2017   Intrapartum Procedures: Episiotomy: None [1]                                         Lacerations:  None [1]  Patient had a delivery of a Viable infant. 04/01/2017  Information for the patient's newborn:  Maddyson, Keil [[id number]]  Delivery Method: Vag-Spont    Pateint had an uncomplicated postpartum course.  She is ambulating, tolerating a regular diet, passing flatus, and urinating well. Patient is discharged home in stable condition on 04/03/17.   Physical exam  Vitals:   04/02/17 0250 04/02/17 1051 04/02/17 1809 04/03/17 0611  BP: (!) 104/54 (!) 105/54 (!) 99/52 (!) 98/54  Pulse: 70 90 84 81  Resp: 18 18 18 16   Temp: 98.2 F (36.8 C) 98.5 F (36.9 C) 98.2 F (36.8 C) 98 F (36.7 C)  TempSrc: Oral Oral  Oral  SpO2: 100%  100%   Weight:      Height:       General: alert, cooperative and no  distress Lochia: appropriate Uterine Fundus: firm Incision: N/A DVT Evaluation: No evidence of DVT seen on physical exam. Labs: Lab Results  Component Value Date   WBC 5.8 04/01/2017   HGB 9.6 (L) 04/01/2017   HCT 28.5 (L) 04/01/2017   MCV 87.4 04/01/2017   PLT 261 04/01/2017   No flowsheet data found.  Discharge instruction: per After Visit Summary and "Baby and Me Booklet".  After visit meds:  Allergies as of 04/03/2017   No Known Allergies     Medication List    TAKE these medications   ferrous sulfate 325 (65 FE) MG tablet Take 1 tablet (325 mg total) by mouth daily.   ibuprofen 600 MG tablet Commonly known as:  ADVIL,MOTRIN Take 1 tablet (600 mg total) by mouth every 6 (six) hours.   prenatal multivitamin Tabs  tablet Take 1 tablet by mouth daily at 12 noon.   senna-docusate 8.6-50 MG tablet Commonly known as:  Senokot-S Take 2 tablets by mouth daily. Start taking on:  04/04/2017       Diet: routine diet  Activity: Advance as tolerated. Pelvic rest for 6 weeks.   Outpatient follow up:6 weeks Follow up Appt:No future appointments. Follow up Visit:No Follow-up on file.  Postpartum contraception: Depo Provera  Newborn Data: Live born female  Birth Weight: 6 lb 15.5 oz (3160 g) APGAR: 9, 9  Baby Feeding: Breast Disposition:home with mother   04/03/2017 Jacquiline Doe, MD   OB FELLOW DISCHARGE ATTESTATION  I confirm that I have verified the information documented in the resident's note and that I have also personally reperformed the physical exam and all medical decision making activities.     Jacquiline Doe, MD 8:04 AM

## 2019-06-25 ENCOUNTER — Other Ambulatory Visit: Payer: Self-pay

## 2019-06-25 ENCOUNTER — Ambulatory Visit (INDEPENDENT_AMBULATORY_CARE_PROVIDER_SITE_OTHER): Payer: Medicaid Other

## 2019-06-25 DIAGNOSIS — Z3201 Encounter for pregnancy test, result positive: Secondary | ICD-10-CM | POA: Diagnosis not present

## 2019-06-25 DIAGNOSIS — Z3481 Encounter for supervision of other normal pregnancy, first trimester: Secondary | ICD-10-CM

## 2019-06-25 LAB — POCT URINE PREGNANCY: Preg Test, Ur: POSITIVE — AB

## 2019-06-25 MED ORDER — BLOOD PRESSURE KIT DEVI: 1.0000 | 0 refills | Status: DC | PRN

## 2019-06-25 MED ORDER — DOXYLAMINE-PYRIDOXINE 10-10 MG PO TBEC: 2.0000 | DELAYED_RELEASE_TABLET | Freq: Every day | ORAL | 5 refills | Status: DC

## 2019-06-25 MED ORDER — VITAFOL GUMMIES 3.33-0.333-34.8 MG PO CHEW: 1.0000 | CHEWABLE_TABLET | Freq: Every day | ORAL | 5 refills | Status: DC

## 2019-06-25 NOTE — Progress Notes (Signed)
Erin Torres presents today for UPT. She has no unusual complaints and complains of nausea without vomiting for since +UPT.  LMP: 04/24/2019 arpx, unsure per pt     OBJECTIVE: Appears well, in no apparent distress.  OB History    Gravida  5   Para  3   Term  3   Preterm      AB  1   Living  3     SAB  1   TAB      Ectopic      Multiple  0   Live Births  3          Home UPT Result: Positive In-Office UPT result: Positive  I have reviewed the patient's medical, obstetrical, social, and family histories, and medications.   ASSESSMENT: Positive pregnancy test  PLAN: Prenatal care to be completed at:  [place] up Diclegis rx Download Babyscripts BP cuff ordered  Pregnancy confirmation letter given per pt's request for insurance purposes

## 2019-06-25 NOTE — Progress Notes (Signed)
I have reviewed this chart and agree with the RN/CMA assessment and management.    K. Meryl Davis, M.D. Attending [facility] (Faculty Practice)   

## 2019-07-18 ENCOUNTER — Ambulatory Visit (INDEPENDENT_AMBULATORY_CARE_PROVIDER_SITE_OTHER): Payer: Medicaid Other | Admitting: Nurse Practitioner

## 2019-07-18 ENCOUNTER — Other Ambulatory Visit: Payer: Self-pay

## 2019-07-18 ENCOUNTER — Encounter: Payer: Self-pay | Admitting: Nurse Practitioner

## 2019-07-18 ENCOUNTER — Other Ambulatory Visit (HOSPITAL_COMMUNITY)
Admission: RE | Admit: 2019-07-18 | Discharge: 2019-07-18 | Disposition: A | Discharge status: Home / Self Care | Payer: Medicaid Other | Source: Ambulatory Visit | Attending: Nurse Practitioner | Admitting: Nurse Practitioner

## 2019-07-18 ENCOUNTER — Encounter: Payer: Self-pay | Admitting: *Deleted

## 2019-07-18 DIAGNOSIS — Z3480 Encounter for supervision of other normal pregnancy, unspecified trimester: Secondary | ICD-10-CM | POA: Insufficient documentation

## 2019-07-18 DIAGNOSIS — Z3A12 12 weeks gestation of pregnancy: Secondary | ICD-10-CM

## 2019-07-18 DIAGNOSIS — Z8669 Personal history of other diseases of the nervous system and sense organs: Secondary | ICD-10-CM

## 2019-07-18 DIAGNOSIS — Z3481 Encounter for supervision of other normal pregnancy, first trimester: Secondary | ICD-10-CM

## 2019-07-18 NOTE — Progress Notes (Signed)
Subjective:   Erin Torres is a 28 y.o. V3K1224 at 97w1dby best clinical estimate being seen today for her first obstetrical visit.  Her obstetrical history is significant for nausea and some vomiting. Patient does intend to breast feed. Pregnancy history fully reviewed.  Patient reports nausea.  She has medication sent to her pharmacy for nausea [facility]  Nausea is improving.  HISTORY: OB History  Gravida Para Term Preterm AB Living  _0 0 1 3  SAB TAB Ectopic Multiple Live Births  1 0 0 0 3    # Outcome Date GA Lbr Len/2nd Weight Sex Delivery Anes PTL Lv  5 Current           4 Term 04/01/17 449w0d 00:09 6 lb 15.5 oz (3.16 kg) M Vag-Spont None  LIV     Name: Cannady,BOY Madai     Apgar1: 9  Apgar5: 9  3 Term 06/16/15 4131w2d:51 / 00:11 7 lb 11.4 oz (3.498 kg) F Vag-Spont None  LIV     Name: Haaland,GIRL Lasharn     Apgar1: 8  Apgar5: 9  2 SAB 2014 10w6w0d        Birth Comments: System Generated. Please review and update pregnancy details.  1 Term 07/11/10 40w034w0db (3.175 kg) M Vag-Spont None  LIV   Past Medical History:  Diagnosis Date  . Anemia   . Medical history non-contributory   . No pertinent past medical history    Past Surgical History:  Procedure Laterality Date  . NO PAST SURGERIES     Family History  Problem Relation Age of Onset  . Hypertension Maternal Grandmother   . Hypertension Paternal Grandmother   . Diabetes Paternal Grandmother   . Anesthesia problems Neg Hx    Social History   Tobacco Use  . Smoking status: Never Smoker  . Smokeless tobacco: Never Used  Substance Use Topics  . Alcohol use: No    Alcohol/week: 0.0 standard drinks  . Drug use: No   No Known Allergies Current Outpatient Medications on File Prior to Visit  Medication Sig Dispense Refill  . Blood Pressure Monitoring (BLOOD PRESSURE KIT) DEVI 1 Device by Does not apply route as needed.  1 Device 0  . Prenatal Vit-Fe Phos-FA-Omega (VITAFOL GUMMIES) 3.33-0.333-34.8 MG CHEW Chew 1 tablet by mouth daily. 90 tablet 5  . Doxylamine-Pyridoxine (DICLEGIS) 10-10 MG TBEC Take 2 tablets by mouth at bedtime. If symptoms persist, add one tablet in the morning and one in the afternoon (Patient not taking: Reported on 07/18/2019) 100 tablet 5  . Prenatal Vit-Fe Fumarate-FA (PRENATAL MULTIVITAMIN) TABS tablet Take 1 tablet by mouth daily at 12 noon.     No current facility-administered medications on file prior to visit.      Exam   Vitals:   07/18/19 0906  BP: 105/73  Pulse: 77  Weight: 136 lb 3.2 oz (61.8 kg)   Fetal Heart Rate (bpm): 145  Uterus:  Fundal Height: 12 cm  Pelvic Exam: Perineum: no hemorrhoids, normal perineum   Vulva: normal external genitalia, no lesions   Vagina:  normal mucosa, normal discharge   Cervix: no lesions and normal, pap smear done.    Adnexa: normal adnexa and no mass, fullness, tenderness   Bony Pelvis: average  System: General: well-developed, well-nourished female in no acute distress   Breast:  normal appearance,  no masses or tenderness   Skin: normal coloration and turgor, no rashes   Neurologic: oriented, normal, negative, normal mood   Extremities: normal strength, tone, and muscle mass, ROM of all joints is normal   HEENT extraocular movement intact and sclera clear, anicteric   Mouth/Teeth mucous membranes moist, pharynx normal without lesions and dental hygiene good   Neck supple and no masses, normal thyroid   Cardiovascular: regular rate and rhythm   Respiratory:  no respiratory distress, normal breath sounds   Abdomen: soft, non-tender; no masses,  no organomegaly     Assessment:   Pregnancy: [id number] Patient Active Problem List   Diagnosis Date Noted  . Supervision of other normal pregnancy, antepartum 07/18/2019  . Hx of migraines 12/17/2014     Plan:  1. Supervision of other normal pregnancy, antepartum 3 previous births  all vaginal - has never used epidural Considering PostPlacental IUD - Mirena or Environmental manager to send an email to the health Dept.  As client has called twice to have FP Medicaid switched to Pregnancy Medicaid Declines flu today - strongly advised to reconsider  - check again at next visit - Cytology - PAP( [employer]) - Cervicovaginal ancillary only( [place]) - Culture, OB Urine - Obstetric Panel, Including HIV - Genetic Screening - US OB Comp Less 14 Wks; Future   Initial labs drawn. Continue prenatal vitamins. Genetic Screening discussed, Panorama - Horizon: ordered. Ultrasound discussed; fetal anatomic survey: discussed but anatomy not yet ordered.  Client had uncertain LMP and requesting to have [place] to confirm dating.. Problem list reviewed and updated. The nature of [place] with multiple MDs and other Advanced Practice Providers was explained to patient; also emphasized that residents, students are part of our team. Routine obstetric precautions reviewed. Return in about 4 weeks (around 08/15/2019) for in person ROB and AFP. And will need anatomy US ordered.  Total face-to-face time with patient: 40 minutes.  Over 50% of encounter was spent on counseling and coordination of care.     Earlie Server, FNP Family Nurse Practitioner, [facility] for [employer], [employer] [facility] 07/18/2019 10:07 AM

## 2019-07-18 NOTE — Progress Notes (Signed)
NOB in office, denies pain today.

## 2019-07-18 NOTE — Patient Instructions (Addendum)
Levonorgestrel intrauterine device (IUD) What is this medicine? LEVONORGESTREL IUD (LEE voe nor jes trel) is a contraceptive (birth control) device. The device is placed inside the uterus by a healthcare professional. It is used to prevent pregnancy. This device can also be used to treat heavy bleeding that occurs during your period. This medicine may be used for other purposes; ask your health care provider or pharmacist if you have questions. COMMON BRAND NAME(S): Kyleena, LILETTA, Mirena, Skyla What should I tell my health care provider before I take this medicine? They need to know if you have any of these conditions:  abnormal Pap smear  cancer of the breast, uterus, or cervix  diabetes  endometritis  genital or pelvic infection now or in the past  have more than one sexual partner or your partner has more than one partner  heart disease  history of an ectopic or tubal pregnancy  immune system problems  IUD in place  liver disease or tumor  problems with blood clots or take blood-thinners  seizures  use intravenous drugs  uterus of unusual shape  vaginal bleeding that has not been explained  an unusual or allergic reaction to levonorgestrel, other hormones, silicone, or polyethylene, medicines, foods, dyes, or preservatives  pregnant or trying to get pregnant  breast-feeding How should I use this medicine? This device is placed inside the uterus by a health care professional. Talk to your pediatrician regarding the use of this medicine in children. Special care may be needed. Overdosage: If you think you have taken too much of this medicine contact a poison control center or emergency room at once. NOTE: This medicine is only for you. Do not share this medicine with others. What if I miss a dose? This does not apply. Depending on the brand of device you have inserted, the device will need to be replaced every 3 to 6 years if you wish to continue using this type  of birth control. What may interact with this medicine? Do not take this medicine with any of the following medications:  amprenavir  bosentan  fosamprenavir This medicine may also interact with the following medications:  aprepitant  armodafinil  barbiturate medicines for inducing sleep or treating seizures  bexarotene  boceprevir  griseofulvin  medicines to treat seizures like carbamazepine, ethotoin, felbamate, oxcarbazepine, phenytoin, topiramate  modafinil  pioglitazone  rifabutin  rifampin  rifapentine  some medicines to treat HIV infection like atazanavir, efavirenz, indinavir, lopinavir, nelfinavir, tipranavir, ritonavir  St. John's wort  warfarin This list may not describe all possible interactions. Give your health care provider a list of all the medicines, herbs, non-prescription drugs, or dietary supplements you use. Also tell them if you smoke, drink alcohol, or use illegal drugs. Some items may interact with your medicine. What should I watch for while using this medicine? Visit your doctor or health care professional for regular check ups. See your doctor if you or your partner has sexual contact with others, becomes HIV positive, or gets a sexual transmitted disease. This product does not protect you against HIV infection (AIDS) or other sexually transmitted diseases. You can check the placement of the IUD yourself by reaching up to the top of your vagina with clean fingers to feel the threads. Do not pull on the threads. It is a good habit to check placement after each menstrual period. Call your doctor right away if you feel more of the IUD than just the threads or if you cannot feel the threads at   all. The IUD may come out by itself. You may become pregnant if the device comes out. If you notice that the IUD has come out use a backup birth control method like condoms and call your health care provider. Using tampons will not change the position of the  IUD and are okay to use during your period. This IUD can be safely scanned with magnetic resonance imaging (MRI) only under specific conditions. Before you have an MRI, tell your healthcare provider that you have an IUD in place, and which type of IUD you have in place. What side effects may I notice from receiving this medicine? Side effects that you should report to your doctor or health care professional as soon as possible:  allergic reactions like skin rash, itching or hives, swelling of the face, lips, or tongue  fever, flu-like symptoms  genital sores  high blood pressure  no menstrual period for 6 weeks during use  pain, swelling, warmth in the leg  pelvic pain or tenderness  severe or sudden headache  signs of pregnancy  stomach cramping  sudden shortness of breath  trouble with balance, talking, or walking  unusual vaginal bleeding, discharge  yellowing of the eyes or skin Side effects that usually do not require medical attention (report to your doctor or health care professional if they continue or are bothersome):  acne  breast pain  change in sex drive or performance  changes in weight  cramping, dizziness, or faintness while the device is being inserted  headache  irregular menstrual bleeding within first 3 to 6 months of use  nausea This list may not describe all possible side effects. Call your doctor for medical advice about side effects. You may report side effects to FDA at 1-800-FDA-1088. Where should I keep my medicine? This does not apply. NOTE: This sheet is a summary. It may not cover all possible information. If you have questions about this medicine, talk to your doctor, pharmacist, or health care provider.  2020 Elsevier/Gold Standard (2018-08-07 13:22:01)    Safe Medications in Pregnancy   Acne: Benzoyl Peroxide Salicylic Acid  Backache/Headache: Tylenol: 2 regular strength every 4 hours OR              2 Extra strength  every 6 hours  Colds/Coughs/Allergies: Benadryl (alcohol free) 25 mg every 6 hours as needed Breath right strips Claritin Cepacol throat lozenges Chloraseptic throat spray Cold-Eeze- up to three times per day Cough drops, alcohol free Flonase Guaifenesin Mucinex Robitussin DM (plain only, alcohol free) Saline nasal spray/drops Sudafed (pseudoephedrine) & Actifed ** use only after [redacted] weeks gestation and if you do not have high blood pressure Tylenol Vicks Vaporub Zinc lozenges Zyrtec   Constipation: Colace Ducolax suppositories Fleet enema Glycerin suppositories Metamucil Milk of magnesia Miralax Senokot Smooth move tea  Diarrhea: Kaopectate Imodium A-D  *NO pepto Bismol  Hemorrhoids: Anusol Anusol HC Preparation H Tucks  Indigestion: Tums Maalox Mylanta Zantac  Pepcid  Insomnia: Benadryl (alcohol free) 25mg  every 6 hours as needed Tylenol PM Unisom, no Gelcaps  Leg Cramps: Tums MagGel  Nausea/Vomiting:  Bonine Dramamine Emetrol Ginger extract Sea bands Meclizine  Nausea medication to take during pregnancy:  Unisom (doxylamine succinate 25 mg tablets) Take one tablet daily at bedtime. If symptoms are not adequately controlled, the dose can be increased to a maximum recommended dose of two tablets daily (1/2 tablet in the morning, 1/2 tablet mid-afternoon and one at bedtime). Vitamin B6 100mg  tablets. Take one tablet twice a  day (up to 200 mg per day).  Skin Rashes: Aveeno products Benadryl cream or 25mg  every 6 hours as needed Calamine Lotion 1% cortisone cream  Yeast infection: Gyne-lotrimin 7 Monistat 7   **If taking multiple medications, please check labels to avoid duplicating the same active ingredients **take medication as directed on the label ** Do not exceed 4000 mg of tylenol in 24 hours **Do not take medications that contain aspirin or ibuprofen    Morning Sickness  Morning sickness is when you feel sick to your  stomach (nauseous) during pregnancy. You may feel sick to your stomach and throw up (vomit). You may feel sick in the morning, but you can feel this way at any time of day. Some women feel very sick to their stomach and cannot stop throwing up (hyperemesis gravidarum). Follow these instructions at home: Medicines  Take over-the-counter and prescription medicines only as told by your doctor. Do not take any medicines until you talk with your doctor about them first.  Taking multivitamins before getting pregnant can stop or lessen the harshness of morning sickness. Eating and drinking  Eat dry toast or crackers before getting out of bed.  Eat 5 or 6 small meals a day.  Eat dry and bland foods like rice and baked potatoes.  Do not eat greasy, fatty, or spicy foods.  Have someone cook for you if the smell of food causes you to feel sick or throw up.  If you feel sick to your stomach after taking prenatal vitamins, take them at night or with a snack.  Eat protein when you need a snack. Nuts, yogurt, and cheese are good choices.  Drink fluids throughout the day.  Try ginger ale made with real ginger, ginger tea made from fresh grated ginger, or ginger candies. General instructions  Do not use any products that have nicotine or tobacco in them, such as cigarettes and e-cigarettes. If you need help quitting, ask your doctor.  Use an air purifier to keep the air in your house free of smells.  Get lots of fresh air.  Try to avoid smells that make you feel sick.  Try: ? Wearing a bracelet that is used for seasickness (acupressure wristband). ? Going to a doctor who puts thin needles into certain body points (acupuncture) to improve how you feel. Contact a doctor if:  You need medicine to feel better.  You feel dizzy or light-headed.  You are losing weight. Get help right away if:  You feel very sick to your stomach and cannot stop throwing up.  You pass out (faint).  You have  very bad pain in your belly. Summary  Morning sickness is when you feel sick to your stomach (nauseous) during pregnancy.  You may feel sick in the morning, but you can feel this way at any time of day.  Making some changes to what you eat may help your symptoms go away. This information is not intended to replace advice given to you by your health care provider. Make sure you discuss any questions you have with your health care provider. Document Released: 11/03/2004 Document Revised: 09/08/2017 Document Reviewed: 10/27/2016 Elsevier Patient Education  2020 [employer].

## 2019-07-19 ENCOUNTER — Encounter: Payer: Self-pay | Admitting: Nurse Practitioner

## 2019-07-19 DIAGNOSIS — D473 Essential (hemorrhagic) thrombocythemia: Secondary | ICD-10-CM | POA: Insufficient documentation

## 2019-07-19 DIAGNOSIS — D75839 Thrombocytosis, unspecified: Secondary | ICD-10-CM | POA: Insufficient documentation

## 2019-07-19 LAB — OBSTETRIC PANEL, INCLUDING HIV
Antibody Screen: NEGATIVE
Basophils Absolute: 0 10*3/uL (ref 0.0–0.2)
Basos: 1 %
EOS (ABSOLUTE): 0.1 10*3/uL (ref 0.0–0.4)
Eos: 2 %
HIV Screen 4th Generation wRfx: NONREACTIVE
Hematocrit: 35.8 % (ref 34.0–46.6)
Hemoglobin: 11.7 g/dL (ref 11.1–15.9)
Hepatitis B Surface Ag: NEGATIVE
Immature Grans (Abs): 0 10*3/uL (ref 0.0–0.1)
Immature Granulocytes: 0 %
Lymphocytes Absolute: 2.1 10*3/uL (ref 0.7–3.1)
Lymphs: 26 %
MCH: 28.5 pg (ref 26.6–33.0)
MCHC: 32.7 g/dL (ref 31.5–35.7)
MCV: 87 fL (ref 79–97)
Monocytes Absolute: 0.6 10*3/uL (ref 0.1–0.9)
Monocytes: 8 %
Neutrophils Absolute: 5.1 10*3/uL (ref 1.4–7.0)
Neutrophils: 63 %
Platelets: 466 10*3/uL — ABNORMAL HIGH (ref 150–450)
RBC: 4.11 x10E6/uL (ref 3.77–5.28)
RDW: 13.8 % (ref 11.7–15.4)
RPR Ser Ql: NONREACTIVE
Rh Factor: POSITIVE
Rubella Antibodies, IGG: 1.99 index (ref >0.99)
WBC: 8 10*3/uL (ref 3.4–10.8)

## 2019-07-20 LAB — CULTURE, OB URINE

## 2019-07-20 LAB — URINE CULTURE, OB REFLEX: Organism ID, Bacteria: NO GROWTH

## 2019-07-22 LAB — CYTOLOGY - PAP: Diagnosis: NEGATIVE

## 2019-07-25 ENCOUNTER — Other Ambulatory Visit: Payer: Self-pay

## 2019-07-25 ENCOUNTER — Encounter: Payer: Self-pay | Admitting: Obstetrics and Gynecology

## 2019-07-25 ENCOUNTER — Ambulatory Visit (HOSPITAL_COMMUNITY)
Admission: RE | Admit: 2019-07-25 | Discharge: 2019-07-25 | Disposition: A | Discharge status: Home / Self Care | Payer: Medicaid Other | Source: Ambulatory Visit | Attending: Nurse Practitioner | Admitting: Nurse Practitioner

## 2019-07-25 DIAGNOSIS — Z3480 Encounter for supervision of other normal pregnancy, unspecified trimester: Secondary | ICD-10-CM | POA: Insufficient documentation

## 2019-07-29 LAB — CERVICOVAGINAL ANCILLARY ONLY
Bacterial Vaginitis (gardnerella): NEGATIVE
Candida Glabrata: NEGATIVE
Candida Vaginitis: NEGATIVE
Chlamydia: NEGATIVE
Comment: NEGATIVE
Comment: NEGATIVE
Comment: NEGATIVE
Comment: NEGATIVE
Comment: NEGATIVE
Comment: NORMAL
Neisseria Gonorrhea: NEGATIVE
Trichomonas: NEGATIVE

## 2019-07-30 ENCOUNTER — Encounter: Payer: Self-pay | Admitting: Obstetrics and Gynecology

## 2019-08-09 ENCOUNTER — Ambulatory Visit
Admission: EM | Admit: 2019-08-09 | Discharge: 2019-08-09 | Disposition: A | Discharge status: Home / Self Care | Payer: Medicaid Other | Attending: Physician Assistant | Admitting: Physician Assistant

## 2019-08-09 ENCOUNTER — Other Ambulatory Visit: Payer: Self-pay

## 2019-08-09 DIAGNOSIS — N3090 Cystitis, unspecified without hematuria: Secondary | ICD-10-CM | POA: Diagnosis present

## 2019-08-09 LAB — POCT URINALYSIS DIP (MANUAL ENTRY)
Bilirubin, UA: NEGATIVE
Blood, UA: NEGATIVE
Glucose, UA: NEGATIVE mg/dL
Ketones, POC UA: NEGATIVE mg/dL
Nitrite, UA: NEGATIVE
Protein Ur, POC: NEGATIVE mg/dL
Spec Grav, UA: >=1.03 — AB (ref 1.010–1.025)
Urobilinogen, UA: 0.2 E.U./dL
pH, UA: 6.5 (ref 5.0–8.0)

## 2019-08-09 MED ORDER — CEPHALEXIN 500 MG PO CAPS: 500.0000 mg | ORAL_CAPSULE | Freq: Two times a day (BID) | ORAL | 0 refills | Status: DC

## 2019-08-09 NOTE — ED Provider Notes (Signed)
[facility]    CSN: [id number] Arrival date & time: 08/09/19  1045      History   Chief Complaint Chief Complaint  Patient presents with  . Urinary Tract Infection    HPI Erin Torres is a 28 y.o. female.   28 year old female who is [redacted] weeks pregnant comes in for 1.5-week history of urinary changes.  Has had urinary frequency, dysuria without hematuria.  Denies vaginal discharge, itching, bleeding.  Denies abdominal pain, nausea, vomiting.  Denies fever, chills, body aches. Last OB appointment 95/06/3266 without complications. Next OB appointment 08/15/2019.     Past Medical History:  Diagnosis Date  . Anemia   . Medical history non-contributory   . No pertinent past medical history     Patient Active Problem List   Diagnosis Date Noted  . Thrombocytosis ([place]) 07/19/2019  . Supervision of other normal pregnancy, antepartum 07/18/2019  . Hx of migraines 12/17/2014    Past Surgical History:  Procedure Laterality Date  . NO PAST SURGERIES      OB History    Gravida  5   Para  3   Term  3   Preterm      AB  1   Living  3     SAB  1   TAB      Ectopic      Multiple  0   Live Births  3            Home Medications    Prior to Admission medications   Medication Sig Start Date End Date Taking? Authorizing Provider  Blood Pressure Monitoring (BLOOD PRESSURE KIT) DEVI 1 Device by Does not apply route as needed. 06/25/19   Sloan Leiter, MD  cephALEXin (KEFLEX) 500 MG capsule Take 1 capsule (500 mg total) by mouth 2 (two) times daily. 08/09/19   Tasia Catchings, Sanita Estrada V, PA-C  Doxylamine-Pyridoxine (DICLEGIS) 10-10 MG TBEC Take 2 tablets by mouth at bedtime. If symptoms persist, add one tablet in the morning and one in the afternoon Patient not taking: Reported on 07/18/2019 06/25/19   Sloan Leiter, MD  Prenatal Vit-Fe Fumarate-FA (PRENATAL MULTIVITAMIN) TABS tablet Take 1 tablet by mouth daily at 12 noon.    [provider]   Prenatal Vit-Fe Phos-FA-Omega (VITAFOL GUMMIES) 3.33-0.333-34.8 MG CHEW Chew 1 tablet by mouth daily. 06/25/19   Sloan Leiter, MD    Family History Family History  Problem Relation Age of Onset  . Hypertension Maternal Grandmother   . Hypertension Paternal Grandmother   . Diabetes Paternal Grandmother   . Anesthesia problems Neg Hx     Social History Social History   Tobacco Use  . Smoking status: Never Smoker  . Smokeless tobacco: Never Used  Substance Use Topics  . Alcohol use: No    Alcohol/week: 0.0 standard drinks  . Drug use: No     Allergies   Patient has no known allergies.   Review of Systems Review of Systems  Reason unable to perform ROS: See HPI as above.     Physical Exam Triage Vital Signs ED Triage Vitals  Enc Vitals Group     BP 08/09/19 1056 108/71     Pulse Rate 08/09/19 1056 90     Resp 08/09/19 1056 18     Temp 08/09/19 1056 98.8 F (37.1 C)     Temp Source 08/09/19 1056 Oral     SpO2 08/09/19 1056 97 %     Weight --  Height --      Head Circumference --      Peak Flow --      Pain Score 08/09/19 1057 0     Pain Loc --      Pain Edu? --      Excl. in [place]? --    No data found.  Updated Vital Signs BP 108/71 (BP Location: Left Arm)   Pulse 90   Temp 98.8 F (37.1 C) (Oral)   Resp 18   LMP 04/24/2019 (Within Months)   SpO2 97%   Physical Exam Constitutional:      General: She is not in acute distress.    Appearance: She is well-developed. She is not ill-appearing, toxic-appearing or diaphoretic.  HENT:     Head: Normocephalic and atraumatic.  Eyes:     Conjunctiva/sclera: Conjunctivae normal.     Pupils: Pupils are equal, round, and reactive to light.  Cardiovascular:     Rate and Rhythm: Normal rate and regular rhythm.     Heart sounds: Normal heart sounds. No murmur. No friction rub. No gallop.   Pulmonary:     Effort: Pulmonary effort is normal.     Breath sounds: Normal breath sounds. No wheezing or rales.   Abdominal:     General: Bowel sounds are normal.     Palpations: Abdomen is soft.     Tenderness: There is no abdominal tenderness. There is no right CVA tenderness, left CVA tenderness, guarding or rebound.  Skin:    General: Skin is warm and dry.  Neurological:     Mental Status: She is alert and oriented to person, place, and time.  Psychiatric:        Behavior: Behavior normal.        Judgment: Judgment normal.    UC Treatments / Results  Labs (all labs ordered are listed, but only abnormal results are displayed) Labs Reviewed  POCT URINALYSIS DIP (MANUAL ENTRY) - Abnormal; Notable for the following components:      Result Value   Spec Grav, UA >=1.030 (*)    Leukocytes, UA Small (1+) (*)    All other components within normal limits  URINE CULTURE    EKG   Radiology No results found.  Procedures Procedures (including critical care time)  Medications Ordered in UC Medications - No data to display  Initial Impression / Assessment and Plan / UC Course  I have reviewed the triage vital signs and the nursing notes.  Pertinent labs & imaging results that were available during my care of the patient were reviewed by me and considered in my medical decision making (see chart for details).    Discussed dipstick results. Discussed treatment for UTI vs awaiting urine culture. Given symptoms, patient would like treatment, start keflex as directed. Will send for urine culture. Push fluids. Return precautions given. Patient expresses understanding and agrees to plan.  Final Clinical Impressions(s) / UC Diagnoses   Final diagnoses:  Cystitis   ED Prescriptions    Medication Sig Dispense Auth. Provider   cephALEXin (KEFLEX) 500 MG capsule Take 1 capsule (500 mg total) by mouth 2 (two) times daily. 10 capsule Ok Edwards, PA-C     PDMP not reviewed this encounter.   Ok Edwards, PA-C 08/09/19 1157

## 2019-08-09 NOTE — ED Triage Notes (Signed)
Pt c/o urinary difficulty and burning x1.5 wks states she is [redacted]wks pregnant.

## 2019-08-09 NOTE — Discharge Instructions (Signed)
Your urine was positive for an urinary tract infection. Start keflex as directed. Keep hydrated, urine should be clear to pale yellow in color. If noticing fever, abdominal pain, vaginal bleeding, go to the ED for further evaluation needed.

## 2019-08-10 ENCOUNTER — Telehealth: Payer: Self-pay | Admitting: Emergency Medicine

## 2019-08-10 LAB — URINE CULTURE: Culture: <10000 — AB

## 2019-08-10 NOTE — Telephone Encounter (Signed)
Checked in on patient, discussed medications, and encouraged return call with any continuing questions or concerns.    

## 2019-08-15 ENCOUNTER — Other Ambulatory Visit: Payer: Self-pay

## 2019-08-15 ENCOUNTER — Ambulatory Visit (INDEPENDENT_AMBULATORY_CARE_PROVIDER_SITE_OTHER): Payer: Medicaid Other

## 2019-08-15 VITALS — BP 122/68 | HR 102 | Wt 138.0 lb

## 2019-08-15 DIAGNOSIS — Z3A14 14 weeks gestation of pregnancy: Secondary | ICD-10-CM

## 2019-08-15 DIAGNOSIS — Z3482 Encounter for supervision of other normal pregnancy, second trimester: Secondary | ICD-10-CM

## 2019-08-15 DIAGNOSIS — Z3480 Encounter for supervision of other normal pregnancy, unspecified trimester: Secondary | ICD-10-CM

## 2019-08-15 NOTE — Progress Notes (Signed)
Pt presents for ROB w/o concerns today.  Urgent Care visit last wk possible UTI - UCx negative

## 2019-08-15 NOTE — Patient Instructions (Signed)
Alpha-Fetoprotein Test Why am I having this test? The alpha-fetoprotein test is most commonly used in pregnant women to help screen for birth defects in their unborn baby. It can be used to screen for birth defects, such as chromosome (DNA) abnormalities, problems with the brain or spinal cord, or problems with the abdominal wall of the unborn baby (fetus). The alpha-fetoprotein test may also be done for men or non-pregnant women to check for certain cancers. What is being tested? This test measures the amount of alpha-fetoprotein (AFP) in your blood. AFP is a protein that is made by the liver. Levels can be detected in the mother's blood during pregnancy, starting at 10 weeks and peaking at 16-18 weeks of the pregnancy. Abnormal levels can sometimes be a sign of a birth defect in the baby. Certain cancers can cause a high level of AFP in men and non-pregnant women. What kind of sample is taken?  A blood sample is required for this test. It is usually collected by inserting a needle into a blood vessel. How are the results reported? Your test results will be reported as values. Your health care provider will compare your results to normal ranges that were established after testing a large group of people (reference values). Reference values may vary among labs and hospitals. For this test, common reference values are:  Adult: Less than 40 ng/mL or less than 40 mcg/L (SI units).  Child younger than 1 year: Less than 30 ng/mL. If you are pregnant, the values may also vary based on how long you have been pregnant. What do the results mean? Results that are above the reference values in pregnant women may indicate the following for the baby:  Neural tube defects, such as abnormalities of the spinal cord or brain.  Abdominal wall defects.  Multiple pregnancy such as twins.  Fetal distress or fetal death. Results that are above the reference values in men or non-pregnant women may indicate:   Reproductive cancers, such as ovarian or testicular cancer.  Liver cancer.  Liver cell death.  Other types of cancer. Very low levels of AFP in pregnant women may indicate the following for the baby:  Down syndrome.  Fetal death. Talk with your health care provider about what your results mean. Questions to ask your health care provider Ask your health care provider, or the department that is doing the test:  When will my results be ready?  How will I get my results?  What are my treatment options?  What other tests do I need?  What are my next steps? Summary  The alpha-fetoprotein test is done on pregnant women to help screen for birth defects in their unborn baby.  Certain cancers can cause a high level of AFP in men and non-pregnant women.  For this test, a blood sample is usually collected by inserting a needle into a blood vessel.  Talk with your health care provider about what your results mean. This information is not intended to replace advice given to you by your health care provider. Make sure you discuss any questions you have with your health care provider. Document Released: 10/20/2004 Document Revised: 09/08/2017 Document Reviewed: 05/02/2017 Elsevier Patient Education  [place]. Intrauterine Device Information An intrauterine device (IUD) is a medical device that is inserted in the uterus to prevent pregnancy. It is a small, T-shaped device that has one or two nylon strings hanging down from it. The strings hang out of the lower part of the uterus (  cervix) to allow for future IUD removal. There are two types of IUDs available:  Hormone IUD. This type of IUD is made of plastic and contains the hormone progestin (synthetic progesterone). A hormone IUD may last 3-5 years.  Copper IUD. This type of IUD has copper wire wrapped around it. A copper IUD may last up to 10 years. How is an IUD inserted? An IUD is inserted through the vagina and placed into  the uterus with a minor medical procedure. The exact procedure for IUD insertion may vary among health care providers and hospitals. How does an IUD work? Synthetic progesterone in a hormonal IUD prevents pregnancy by:  Thickening cervical mucus to prevent sperm from entering the uterus.  Thinning the uterine lining to prevent a fertilized egg from being implanted there. Copper in a copper IUD prevents pregnancy by making the uterus and fallopian tubes produce a fluid that kills sperm. What are the advantages of an IUD? Advantages of either type of IUD  It is highly effective in preventing pregnancy.  It is reversible. You can become pregnant shortly after the IUD is removed.  It is low-maintenance and can stay in place for a long time.  There are no estrogen-related side effects.  It can be used when breastfeeding.  It is not associated with weight gain.  It can be inserted right after childbirth, an abortion, or a miscarriage. Advantages of a hormone IUD  If it is inserted within 7 days of your period starting, it works right after it is inserted. If the hormone IUD is inserted at any other time in your cycle, you will need to use a backup method of birth control for 7 days after insertion.  It can make menstrual periods lighter.  It can reduce menstrual cramping.  It can be used for 3-5 years. Advantages of a copper IUD  It works right after it is inserted.  It can be used as a form of emergency birth control if it is inserted within 5 days after having unprotected sex.  It does not interfere with your body's natural hormones.  It can be used for 10 years. What are the disadvantages of an IUD?  An IUD may cause irregular menstrual bleeding for a period of time after insertion.  You may have pain during insertion and have cramping and vaginal bleeding after insertion.  An IUD may cut the uterus (uterine perforation) when it is inserted. This is rare.  An IUD may  cause pelvic inflammatory disease (PID), which is an infection in the uterus and fallopian tubes. This is rare, and it usually happens during the first 20 days after the IUD is inserted.  A copper IUD can make your menstrual flow heavier and more painful. How is an IUD removed?  You will lie on your back with your knees bent and your feet in footrests (stirrups).  A device will be inserted into your vagina to spread apart the vaginal walls (speculum). This will allow your health care provider to see the strings attached to the IUD.  Your health care provider will use a small instrument (forceps) to grasp the IUD strings and pull firmly until the IUD is removed. You may have some discomfort when the IUD is removed. Your health care provider may recommend taking over-the-counter pain relievers, such as ibuprofen, before the procedure. You may also have minor spotting for a few days after the procedure. The exact procedure for IUD removal may vary among health care providers and  hospitals. Is the IUD right for me? Your health care provider will make sure you are a good candidate for an IUD and will discuss the advantages, disadvantages, and possible side effects with you. Summary  An intrauterine device (IUD) is a medical device that is inserted in the uterus to prevent pregnancy. It is a small, T-shaped device that has one or two nylon strings hanging down from it.  A hormone IUD contains the hormone progestin (synthetic progesterone). A copper IUD has copper wire wrapped around it.  Synthetic progesterone in a hormone IUD prevents pregnancy by thickening cervical mucus and thinning the walls of the uterus. Copper in a copper IUD prevents pregnancy by making the uterus and fallopian tubes produce a fluid that kills sperm.  A hormone IUD can be left in place for 3-5 years. A copper IUD can be left in place for up to 10 years.  An IUD is inserted and removed by a health care provider. You may  feel some pain during insertion and removal. Your health care provider may recommend taking over-the-counter pain medicine, such as ibuprofen, before an IUD procedure. This information is not intended to replace advice given to you by your health care provider. Make sure you discuss any questions you have with your health care provider. Document Released: 08/30/2004 Document Revised: 09/08/2017 Document Reviewed: 10/25/2016 Elsevier Patient Education  2020 [employer].

## 2019-08-15 NOTE — Progress Notes (Signed)
   PRENATAL VISIT NOTE  Subjective:  Erin Torres is a 28 y.o. [id number] at [redacted]w[redacted]d who presents today for routine prenatal care.  She is currently being monitored for supervision of a low-risk pregnancy with problems as listed below.  Patient has no pregnancy related concerns, but questions process for delivery and visitation policy in midst of CoVid.  She also questions if she will receive a midwife for delivery.     Patient Active Problem List   Diagnosis Date Noted  . Thrombocytosis ([place]) 07/19/2019  . Supervision of other normal pregnancy, antepartum 07/18/2019  . Hx of migraines 12/17/2014    The following portions of the patient's history were reviewed and updated as appropriate: allergies, current medications, past family history, past medical history, past social history, past surgical history and problem list. Problem list updated.  Objective:   Vitals:   08/15/19 0959  BP: 122/68  Pulse: (!) 102  Weight: 138 lb (62.6 kg)    Fetal Status:     Movement: Present     General:  Alert, oriented and cooperative. Patient is in no acute distress.  Skin: Skin is warm and dry.   Cardiovascular: Regular rate and rhythm.  Respiratory: Normal respiratory effort.   Abdomen: Soft, gravid, appropriate for gestational age.  Pelvic: Cervical exam deferred        Extremities: Normal range of motion.  Edema: None  Mental Status: Normal mood and affect. Normal behavior. Normal judgment and thought content.   Assessment and Plan:  Pregnancy: [id number] at [redacted]w[redacted]d  1. Supervision of other normal pregnancy, antepartum -Apologizes given as AFP unable to be drawn today d/t GA. -Anticipatory guidance for upcoming visits. -Reviewed that next visit would be lab only for AFP and Plts. -Anatomy  US ordered. -Reviewed visitor and support person policy for [facility] in midst of covid. -Reassured that midwives are present for deliveries, but delivery could be performed by students or fellows. -Patient  encouraged to express her desire for midwife, as delivery clinician, if not comfortable with MD or students. - [place] MFM OB COMP + 14 WK; Future   Preterm labor symptoms and general obstetric precautions including but not limited to vaginal bleeding, contractions, leaking of fluid and fetal movement were reviewed with the patient.  Please refer to After Visit Summary for other counseling recommendations.  Return in about 1 week (around 08/22/2019), or Lab Visit Only for AFP.  Future Appointments  Date Time Provider [place]  08/22/2019  2:15 PM [facility] [place] None  09/09/2019  8:30 AM [facility] [place] 1 [facility]  09/25/2019  2:30 PM Shelly Bombard, MD [place] None    Maryann Conners, CNM 08/15/2019, 11:06 AM

## 2019-08-22 ENCOUNTER — Other Ambulatory Visit: Payer: Medicaid Other

## 2019-08-29 ENCOUNTER — Other Ambulatory Visit: Payer: Self-pay

## 2019-08-29 ENCOUNTER — Other Ambulatory Visit: Payer: Medicaid Other

## 2019-08-29 DIAGNOSIS — Z3480 Encounter for supervision of other normal pregnancy, unspecified trimester: Secondary | ICD-10-CM

## 2019-08-31 LAB — AFP, SERUM, OPEN SPINA BIFIDA
AFP MoM: 1.1
AFP Value: 42.3 ng/mL
Gest. Age on Collection Date: 16 weeks
Maternal Age At EDD: 30.5 yr
OSBR Risk 1 IN: 10000
Test Results:: NEGATIVE
Weight: 138 [lb_av]

## 2019-09-09 ENCOUNTER — Ambulatory Visit (HOSPITAL_COMMUNITY)
Admission: RE | Admit: 2019-09-09 | Discharge: 2019-09-09 | Disposition: A | Discharge status: Home / Self Care | Payer: Medicaid Other | Source: Ambulatory Visit | Attending: Obstetrics and Gynecology | Admitting: Obstetrics and Gynecology

## 2019-09-09 ENCOUNTER — Other Ambulatory Visit: Payer: Self-pay

## 2019-09-09 DIAGNOSIS — Z363 Encounter for antenatal screening for malformations: Secondary | ICD-10-CM

## 2019-09-09 DIAGNOSIS — Z3A18 18 weeks gestation of pregnancy: Secondary | ICD-10-CM | POA: Diagnosis not present

## 2019-09-09 DIAGNOSIS — Z3480 Encounter for supervision of other normal pregnancy, unspecified trimester: Secondary | ICD-10-CM | POA: Diagnosis present

## 2019-09-25 ENCOUNTER — Telehealth: Payer: No Typology Code available for payment source | Admitting: Obstetrics

## 2019-10-01 ENCOUNTER — Telehealth (INDEPENDENT_AMBULATORY_CARE_PROVIDER_SITE_OTHER): Payer: No Typology Code available for payment source | Admitting: Obstetrics

## 2019-10-01 ENCOUNTER — Encounter: Payer: Self-pay | Admitting: Obstetrics

## 2019-10-01 DIAGNOSIS — D473 Essential (hemorrhagic) thrombocythemia: Secondary | ICD-10-CM

## 2019-10-01 DIAGNOSIS — Z3A21 21 weeks gestation of pregnancy: Secondary | ICD-10-CM

## 2019-10-01 DIAGNOSIS — O99112 Other diseases of the blood and blood-forming organs and certain disorders involving the immune mechanism complicating pregnancy, second trimester: Secondary | ICD-10-CM

## 2019-10-01 DIAGNOSIS — D75839 Thrombocytosis, unspecified: Secondary | ICD-10-CM

## 2019-10-01 DIAGNOSIS — O0990 Supervision of high risk pregnancy, unspecified, unspecified trimester: Secondary | ICD-10-CM

## 2019-10-01 DIAGNOSIS — O0992 Supervision of high risk pregnancy, unspecified, second trimester: Secondary | ICD-10-CM

## 2019-10-01 MED ORDER — BLOOD PRESSURE KIT DEVI: 1.0000 | 0 refills | Status: DC | PRN

## 2019-10-01 NOTE — Progress Notes (Signed)
I connected with  Erin Torres on 10/01/19 by a video enabled telemedicine application and verified that I am speaking with the correct person using two identifiers.   I discussed the limitations of evaluation and management by telemedicine. The patient expressed understanding and agreed to proceed.  MyChart ROB, reports no problems today. BP Cuff reordered.

## 2019-10-01 NOTE — Progress Notes (Signed)
TELEHEALTH OBSTETRICS PRENATAL VIRTUAL VIDEO VISIT ENCOUNTER NOTE  Provider location: [facility] at [place]   I connected with [name] on 10/01/19 at  8:15 AM EST by OB MyChart Video Encounter at home and verified that I am speaking with the correct person using two identifiers.   I discussed the limitations, risks, security and privacy concerns of performing an evaluation and management service virtually and the availability of in person appointments. I also discussed with the patient that there may be a patient responsible charge related to this service. The patient expressed understanding and agreed to proceed. Subjective:  Erin Torres is a 28 y.o. [phone] at 58w4dbeing seen today for ongoing prenatal care.  She is currently monitored for the following issues for this high-risk pregnancy and has Hx of migraines; Supervision of other normal pregnancy, antepartum; and Thrombocytosis ([place] on their problem list.  Patient reports no complaints.  Contractions: Not present. Vag. Bleeding: None.  Movement: Present. Denies any leaking of fluid.   The following portions of the patient's history were reviewed and updated as appropriate: allergies, current medications, past family history, past medical history, past social history, past surgical history and problem list.   Objective:  There were no vitals filed for this visit.  Fetal Status:     Movement: Present     General:  Alert, oriented and cooperative. Patient is in no acute distress.  Respiratory: Normal respiratory effort, no problems with respiration noted  Mental Status: Normal mood and affect. Normal behavior. Normal judgment and thought content.  Rest of physical exam deferred due to type of encounter  Imaging: UKoreaMFM OB COMP + 14 WK  Result Date: 09/09/2019 ----------------------------------------------------------------------  OBSTETRICS REPORT                       (Signed Final  09/09/2019 11:45 am) ---------------------------------------------------------------------- Patient Info  ID #:       [id number]                         D.O.B.:  110-16-1992(28 yrs)  Name:       Erin Torres             Visit Date: 09/09/2019 08:32 am ---------------------------------------------------------------------- Performed By  Performed By:     MJacob MooresBS,       Ref. Address:     Faculty Practice                    RDMS, RVT  Attending:        RTama HighMD        [facility]  Referred By:      JGavin Pound                   CNM ---------------------------------------------------------------------- Orders   #  Description                          Code  Ordered By   1  [place] MFM OB COMP + 14 WK               [id number]     JESSICA EMLY  ----------------------------------------------------------------------   #  Order #                    Accession #                 Episode #   1  [id number]                  [id number]                  [id number]  ---------------------------------------------------------------------- Indications   [redacted] weeks gestation of pregnancy                Z3A.18   Encounter for antenatal screening for          Z36.3   malformations (low risk NIPS)  ---------------------------------------------------------------------- Fetal Evaluation  Num Of Fetuses:         1  Fetal Heart Rate(bpm):  140  Cardiac Activity:       Observed  Presentation:           Variable  Placenta:               Anterior  P. Cord Insertion:      Visualized, central  Amniotic Fluid  AFI FV:      Within normal limits                              Largest Pocket(cm)                              4.33 ---------------------------------------------------------------------- Biometry  BPD:      40.2  mm     G. Age:  18w 1d         40  %    CI:        75.33   %    70 - 86                                                           FL/HC:      18.4   %    15.8 - 18  HC:      146.9  mm     G. Age:  17w 6d         16  %    HC/AC:      1.08        1.07 - 1.29  AC:      136.4  mm     G. Age:  19w 1d         69  %    FL/BPD:     67.4   %  FL:       27.1  mm     G. Age:  18w 2d         38  %    FL/AC:      19.9   %    20 - 24  HUM:      27.8  mm  G. Age:  18w 6d         67  %  CER:      18.8  mm     G. Age:  18w 3d         20  %  NFT:       4.1  mm  LV:        5.9  mm  CM:        2.8  mm  Est. FW:     248  gm      0 lb 9 oz     56  % ---------------------------------------------------------------------- OB History  Gravidity:    5         Term:   3        Prem:   0        SAB:   1  TOP:          0       Ectopic:  0        Living: 3 ---------------------------------------------------------------------- Gestational Age  LMP:           19w 5d        Date:  04/24/19                 EDD:   01/29/20  U/S Today:     18w 3d                                        EDD:   02/07/20  Best:          18w 3d     Det. By:  Loman Chroman         EDD:   02/07/20                                      (07/25/19) ---------------------------------------------------------------------- Anatomy  Cranium:               Appears normal         LVOT:                   Appears normal  Cavum:                 Appears normal         Aortic Arch:            Appears normal  Ventricles:            Appears normal         Ductal Arch:            Appears normal  Choroid Plexus:        Appears normal         Diaphragm:              Appears normal  Cerebellum:            Appears normal         Stomach:                Appears normal, left  sided  Posterior Fossa:       Appears normal         Abdomen:                Appears normal  Nuchal Fold:           Appears normal         Abdominal Wall:         Appears nml (cord                                                                        insert, abd wall)  Face:                   Appears normal         Cord Vessels:           Appears normal (3                         (orbits and profile)                           vessel cord)  Lips:                  Appears normal         Kidneys:                Appear normal  Palate:                Not well visualized    Bladder:                Appears normal  Thoracic:              Appears normal         Spine:                  Appears normal  Heart:                 Appears normal         Upper Extremities:      Appears normal                         (4CH, axis, and                         situs)  RVOT:                  Appears normal         Lower Extremities:      Appears normal  Other:  Female gender. Heels and 5th digit visualized. Nasal bone visualized.          Technically difficult due to fetal position. ---------------------------------------------------------------------- Cervix Uterus Adnexa  Cervix  Length:           3.12  cm.  Normal appearance by transabdominal scan.  Uterus  No abnormality visualized.  Left Ovary  Within normal limits. No adnexal mass visualized.  Right Ovary  Within normal limits. No adnexal mass visualized.  Cul De Sac  No free fluid seen.  Adnexa  No abnormality visualized. ---------------------------------------------------------------------- Impression  We performed fetal anatomy scan. No makers of  aneuploidies or fetal structural defects are seen. Fetal  biometry is consistent with her previously-established dates.  Amniotic fluid is normal and good fetal activity is seen.  Patient understands the limitations of ultrasound in detecting  fetal anomalies.  MSAFP screening showed low risk for open-neural tube  defects.  On cell-free fetal DNA screening, the risks of fetal  aneuploidies are not increased.  Obstetric history is significant for 3 previous term vaginal  deliveries. ---------------------------------------------------------------------- Recommendations  -Follow-up scans as clinically indicated.  ----------------------------------------------------------------------                  Tama High, MD Electronically Signed Final Report   09/09/2019 11:45 am ----------------------------------------------------------------------   Assessment and Plan:  Pregnancy: [id number] at 50w4d1. Supervision of high risk pregnancy, antepartum Rx: - Blood Pressure Monitoring (BLOOD PRESSURE KIT) DEVI; 1 Device by Does not apply route as needed. Check BP regularly and record readings into the Babys[employer]s App.  Large Cuff.  Dispense: 1 each; Refill: 0  2. Thrombocytosis ([place] Rx: - CBC; Future  Preterm labor symptoms and general obstetric precautions including but not limited to vaginal bleeding, contractions, leaking of fluid and fetal movement were reviewed in detail with the patient. I discussed the assessment and treatment plan with the patient. The patient was provided an opportunity to ask questions and all were answered. The patient agreed with the plan and demonstrated an understanding of the instructions. The patient was advised to call back or seek an in-person office evaluation/[facility]. Please refer to After Visit Summary for other counseling recommendations.   I provided 10 minutes of face-to-face time during this encounter.  Return in about 4 weeks (around 10/29/2019) for MyChart.  No future appointments.  CBaltazar Najjar MD [facility] 10/01/2019

## 2019-10-11 NOTE — L&D Delivery Note (Signed)
OB/GYN Faculty Practice Delivery Note  Erin Torres is a 29 y.o. [id number] s/p NSVD at [redacted]w[redacted]d. She was admitted for post dates IOL.   ROM: 0h 56m with clear fluid GBS Status:  Negative/-- (04/08 0112) Maximum Maternal Temperature: 98.3 F    Labor Progress: . Patient arrived at 1 cm dilation and was induced with FB, misoprostol x1, pitocin, and AROM. She delivered 42 min after AROM.   Delivery Date/Time: 02/15/2020 at 0608 Delivery: Called to room and patient was complete and pushing. Head delivered in OA position. No nuchal cord present. Shoulder and body delivered in usual fashion. Infant with spontaneous cry, placed on mother's abdomen, dried and stimulated. Cord clamped x 2 after 1-minute delay, and cut by patient. Cord blood drawn. Placenta delivered spontaneously with gentle cord traction. Fundus firm with massage and Pitocin. Labia, perineum, vagina, and cervix inspected with hemostatic 1st degree perineal and left periurethral, neither repaired.   Placenta: 3v intact to L&D Complications: none Lacerations: hemostatic 1st degree perineal and left periurethral, neither repaired EBL: 50 cc Analgesia: none   Infant: APGAR (1 MIN): 9   APGAR (5 MINS): 9    Weight: [id number] grams  Augustin Coupe, MD/MPH OB/GYN Fellow, Faculty Practice

## 2019-10-29 ENCOUNTER — Encounter: Payer: Self-pay | Admitting: Obstetrics

## 2019-10-29 ENCOUNTER — Telehealth (INDEPENDENT_AMBULATORY_CARE_PROVIDER_SITE_OTHER): Payer: No Typology Code available for payment source | Admitting: Obstetrics

## 2019-10-29 DIAGNOSIS — O99712 Diseases of the skin and subcutaneous tissue complicating pregnancy, second trimester: Secondary | ICD-10-CM

## 2019-10-29 DIAGNOSIS — Z3A25 25 weeks gestation of pregnancy: Secondary | ICD-10-CM

## 2019-10-29 DIAGNOSIS — D75839 Thrombocytosis, unspecified: Secondary | ICD-10-CM

## 2019-10-29 DIAGNOSIS — D473 Essential (hemorrhagic) thrombocythemia: Secondary | ICD-10-CM

## 2019-10-29 DIAGNOSIS — Z8669 Personal history of other diseases of the nervous system and sense organs: Secondary | ICD-10-CM

## 2019-10-29 DIAGNOSIS — O0992 Supervision of high risk pregnancy, unspecified, second trimester: Secondary | ICD-10-CM

## 2019-10-29 DIAGNOSIS — L299 Pruritus, unspecified: Secondary | ICD-10-CM

## 2019-10-29 DIAGNOSIS — O0990 Supervision of high risk pregnancy, unspecified, unspecified trimester: Secondary | ICD-10-CM

## 2019-10-29 MED ORDER — PREDNISONE 10 MG (21) PO TBPK: ORAL_TABLET | ORAL | 0 refills | Status: DC

## 2019-10-29 NOTE — Progress Notes (Signed)
Pt is on the phone preparing for virtual visit with provider. [redacted]w[redacted]d. Pt reports she has not had a chance to go pick up her BP cuff yet, she denies any HA, blurry vision, or abdominal pain.

## 2019-10-29 NOTE — Progress Notes (Signed)
   TELEHEALTH OBSTETRICS PRENATAL VIRTUAL VIDEO VISIT ENCOUNTER NOTE  Provider location: [facility] [employer] [facility] at [place]   I connected with Erin Torres on 10/29/19 at  9:15 AM EST by [facility] Video Encounter at home and verified that I am speaking with the correct person using two identifiers.   I discussed the limitations, risks, security and privacy concerns of performing an evaluation and management service virtually and the availability of in person appointments. I also discussed with the patient that there may be a patient responsible charge related to this service. The patient expressed understanding and agreed to proceed. Subjective:  Erin Torres is a 29 y.o. [phone] at [redacted]w[redacted]d being seen today for ongoing prenatal care.  She is currently monitored for the following issues for this high-risk pregnancy and has Hx of migraines; Supervision of other normal pregnancy, antepartum; and Thrombocytosis ([place]) on their problem list.  Patient reports severe generalized pruritus.  Contractions: Not present. Vag. Bleeding: None.  Movement: Present. Denies any leaking of fluid.   The following portions of the patient's history were reviewed and updated as appropriate: allergies, current medications, past family history, past medical history, past social history, past surgical history and problem list.   Objective:  There were no vitals filed for this visit.  Fetal Status:     Movement: Present     General:  Alert, oriented and cooperative. Patient is in no acute distress.  Respiratory: Normal respiratory effort, no problems with respiration noted  Mental Status: Normal mood and affect. Normal behavior. Normal judgment and thought content.  Rest of physical exam deferred due to type of encounter  Imaging: No results found.  Assessment and Plan:  Pregnancy: [id number] at [redacted]w[redacted]d  1. Supervision of high risk pregnancy, antepartum  2. Thrombocytosis ([place]) Rx: - CBC;  Future  3. Hx of migraines - clinically stable  4. Pruritus gravidarum, second trimester Rx: - predniSONE (STERAPRED UNI-PAK 21 TAB) 10 MG (21) TBPK tablet; Take as directed.  Dispense: 21 tablet; Refill: 0 - Bile acids, total; Future - Comprehensive metabolic panel; Future   Preterm labor symptoms and general obstetric precautions including but not limited to vaginal bleeding, contractions, leaking of fluid and fetal movement were reviewed in detail with the patient. I discussed the assessment and treatment plan with the patient. The patient was provided an opportunity to ask questions and all were answered. The patient agreed with the plan and demonstrated an understanding of the instructions. The patient was advised to call back or seek an in-person office evaluation/go to [facility] at [facility] for any urgent or concerning symptoms. Please refer to After Visit Summary for other counseling recommendations.   I provided 15 minutes of face-to-face time during this encounter.  Return in about 3 weeks (around 11/19/2019) for [facility], 2 hour OGTT.   Erin Najjar, MD Center for [facility] [place], [place] [facility] 10/29/2019

## 2019-11-19 ENCOUNTER — Other Ambulatory Visit: Payer: Self-pay

## 2019-11-19 ENCOUNTER — Ambulatory Visit (INDEPENDENT_AMBULATORY_CARE_PROVIDER_SITE_OTHER): Payer: Medicaid Other | Admitting: Obstetrics

## 2019-11-19 ENCOUNTER — Encounter: Payer: Self-pay | Admitting: Obstetrics

## 2019-11-19 ENCOUNTER — Other Ambulatory Visit: Payer: Medicaid Other

## 2019-11-19 VITALS — BP 105/56 | HR 86 | Wt 143.3 lb

## 2019-11-19 DIAGNOSIS — Z8669 Personal history of other diseases of the nervous system and sense organs: Secondary | ICD-10-CM

## 2019-11-19 DIAGNOSIS — D75839 Thrombocytosis, unspecified: Secondary | ICD-10-CM

## 2019-11-19 DIAGNOSIS — Z3A28 28 weeks gestation of pregnancy: Secondary | ICD-10-CM

## 2019-11-19 DIAGNOSIS — D473 Essential (hemorrhagic) thrombocythemia: Secondary | ICD-10-CM

## 2019-11-19 DIAGNOSIS — O0993 Supervision of high risk pregnancy, unspecified, third trimester: Secondary | ICD-10-CM

## 2019-11-19 DIAGNOSIS — O0990 Supervision of high risk pregnancy, unspecified, unspecified trimester: Secondary | ICD-10-CM

## 2019-11-19 NOTE — Progress Notes (Signed)
Pt is here for ROB and 2 hr GTT, [redacted]w[redacted]d.

## 2019-11-19 NOTE — Progress Notes (Signed)
Subjective:  Erin Torres is a 29 y.o. [phone] at [redacted]w[redacted]d being seen today for ongoing prenatal care.  She is currently monitored for the following issues for this high-risk pregnancy and has Hx of migraines; Supervision of other normal pregnancy, antepartum; and Thrombocytosis ([place]) on their problem list.  Patient reports no complaints.  Contractions: Irritability. Vag. Bleeding: None.  Movement: Present. Denies leaking of fluid.   The following portions of the patient's history were reviewed and updated as appropriate: allergies, current medications, past family history, past medical history, past social history, past surgical history and problem list. Problem list updated.  Objective:   Vitals:   11/19/19 0940  BP: (!) 105/56  Pulse: 86  Weight: 143 lb 4.8 oz (65 kg)    Fetal Status:     Movement: Present     General:  Alert, oriented and cooperative. Patient is in no acute distress.  Skin: Skin is warm and dry. No rash noted.   Cardiovascular: Normal heart rate noted  Respiratory: Normal respiratory effort, no problems with respiration noted  Abdomen: Soft, gravid, appropriate for gestational age. Pain/Pressure: Absent     Pelvic:  Cervical exam deferred        Extremities: Normal range of motion.  Edema: None  Mental Status: Normal mood and affect. Normal behavior. Normal judgment and thought content.   Urinalysis:      Assessment and Plan:  Pregnancy: [name] at [redacted]w[redacted]d  1. Supervision of high risk pregnancy, antepartum Rx: - Glucose Tolerance, 2 Hours w/1 Hour - CBC - RPR - HIV Antibody (routine testing w rflx)  2. Thrombocytosis ([place])  3. Hx of migraines - clinically stable   Preterm labor symptoms and general obstetric precautions including but not limited to vaginal bleeding, contractions, leaking of fluid and fetal movement were reviewed in detail with the patient. Please refer to After Visit Summary for other counseling recommendations.  Return in about 2  weeks (around 12/03/2019) for MyChart.   Shelly Bombard, MD  11/19/2019

## 2019-11-20 ENCOUNTER — Other Ambulatory Visit: Payer: Self-pay | Admitting: Obstetrics

## 2019-11-20 DIAGNOSIS — D508 Other iron deficiency anemias: Secondary | ICD-10-CM

## 2019-11-20 LAB — CBC
Hematocrit: 31.9 % — ABNORMAL LOW (ref 34.0–46.6)
Hemoglobin: 10.4 g/dL — ABNORMAL LOW (ref 11.1–15.9)
MCH: 28.9 pg (ref 26.6–33.0)
MCHC: 32.6 g/dL (ref 31.5–35.7)
MCV: 89 fL (ref 79–97)
Platelets: 344 10*3/uL (ref 150–450)
RBC: 3.6 x10E6/uL — ABNORMAL LOW (ref 3.77–5.28)
RDW: 13.3 % (ref 11.7–15.4)
WBC: 6.2 10*3/uL (ref 3.4–10.8)

## 2019-11-20 LAB — GLUCOSE TOLERANCE, 2 HOURS W/ 1HR
Glucose, 1 hour: 156 mg/dL (ref 65–179)
Glucose, 2 hour: 112 mg/dL (ref 65–152)
Glucose, Fasting: 77 mg/dL (ref 65–91)

## 2019-11-20 LAB — HIV ANTIBODY (ROUTINE TESTING W REFLEX): HIV Screen 4th Generation wRfx: NONREACTIVE

## 2019-11-20 LAB — RPR: RPR Ser Ql: NONREACTIVE

## 2019-11-20 MED ORDER — FERROUS SULFATE 325 (65 FE) MG PO TABS: 325.0000 mg | ORAL_TABLET | Freq: Two times a day (BID) | ORAL | 5 refills | Status: DC

## 2019-12-03 ENCOUNTER — Telehealth (INDEPENDENT_AMBULATORY_CARE_PROVIDER_SITE_OTHER): Payer: No Typology Code available for payment source | Admitting: Obstetrics

## 2019-12-03 ENCOUNTER — Encounter: Payer: Self-pay | Admitting: Obstetrics

## 2019-12-03 DIAGNOSIS — D473 Essential (hemorrhagic) thrombocythemia: Secondary | ICD-10-CM

## 2019-12-03 DIAGNOSIS — O99891 Other specified diseases and conditions complicating pregnancy: Secondary | ICD-10-CM

## 2019-12-03 DIAGNOSIS — Z3A30 30 weeks gestation of pregnancy: Secondary | ICD-10-CM

## 2019-12-03 DIAGNOSIS — D508 Other iron deficiency anemias: Secondary | ICD-10-CM

## 2019-12-03 DIAGNOSIS — O99013 Anemia complicating pregnancy, third trimester: Secondary | ICD-10-CM

## 2019-12-03 DIAGNOSIS — O99712 Diseases of the skin and subcutaneous tissue complicating pregnancy, second trimester: Secondary | ICD-10-CM

## 2019-12-03 DIAGNOSIS — L299 Pruritus, unspecified: Secondary | ICD-10-CM

## 2019-12-03 DIAGNOSIS — O99713 Diseases of the skin and subcutaneous tissue complicating pregnancy, third trimester: Secondary | ICD-10-CM

## 2019-12-03 DIAGNOSIS — D75839 Thrombocytosis, unspecified: Secondary | ICD-10-CM

## 2019-12-03 DIAGNOSIS — O0990 Supervision of high risk pregnancy, unspecified, unspecified trimester: Secondary | ICD-10-CM

## 2019-12-03 NOTE — Progress Notes (Signed)
S/w pt for virtual visit, pt reports fetal movement and hip pain.

## 2019-12-03 NOTE — Progress Notes (Signed)
   TELEHEALTH OBSTETRICS PRENATAL VIRTUAL VIDEO VISIT ENCOUNTER NOTE  Provider location: [facility] [employer] [facility] at [place]   I connected with Erin Torres on 12/03/19 at  9:45 AM EST by OB MyChart Video Encounter at home and verified that I am speaking with the correct person using two identifiers.   I discussed the limitations, risks, security and privacy concerns of performing an evaluation and management service virtually and the availability of in person appointments. I also discussed with the patient that there may be a patient responsible charge related to this service. The patient expressed understanding and agreed to proceed. Subjective:  Erin Torres is a 29 y.o. [phone] at [redacted]w[redacted]d being seen today for ongoing prenatal care.  She is currently monitored for the following issues for this high-risk pregnancy and has Hx of migraines; Supervision of other normal pregnancy, antepartum; and Thrombocytosis ([place]) on their problem list.  Patient reports no complaints.  Contractions: Not present. Vag. Bleeding: None.  Movement: Present. Denies any leaking of fluid.   The following portions of the patient's history were reviewed and updated as appropriate: allergies, current medications, past family history, past medical history, past social history, past surgical history and problem list.   Objective:  There were no vitals filed for this visit.  Fetal Status:     Movement: Present     General:  Alert, oriented and cooperative. Patient is in no acute distress.  Respiratory: Normal respiratory effort, no problems with respiration noted  Mental Status: Normal mood and affect. Normal behavior. Normal judgment and thought content.  Rest of physical exam deferred due to type of encounter  Imaging: No results found.  Assessment and Plan:  Pregnancy: [id number] at [redacted]w[redacted]d There are no diagnoses linked to this encounter. Preterm labor symptoms and general obstetric precautions  including but not limited to vaginal bleeding, contractions, leaking of fluid and fetal movement were reviewed in detail with the patient. I discussed the assessment and treatment plan with the patient. The patient was provided an opportunity to ask questions and all were answered. The patient agreed with the plan and demonstrated an understanding of the instructions. The patient was advised to call back or seek an in-person office evaluation/go to MAU at [facility] for any urgent or concerning symptoms. Please refer to After Visit Summary for other counseling recommendations.   I provided 10 minutes of face-to-face time during this encounter.  Return in about 2 weeks (around 12/17/2019) for MyChart.   Baltazar Najjar, MD Center for [facility], [place] [facility] 12/03/2019

## 2019-12-17 ENCOUNTER — Encounter: Payer: Self-pay | Admitting: Obstetrics

## 2019-12-17 ENCOUNTER — Telehealth (INDEPENDENT_AMBULATORY_CARE_PROVIDER_SITE_OTHER): Payer: Medicaid Other | Admitting: Obstetrics

## 2019-12-17 DIAGNOSIS — D473 Essential (hemorrhagic) thrombocythemia: Secondary | ICD-10-CM

## 2019-12-17 DIAGNOSIS — O0990 Supervision of high risk pregnancy, unspecified, unspecified trimester: Secondary | ICD-10-CM

## 2019-12-17 DIAGNOSIS — Z8669 Personal history of other diseases of the nervous system and sense organs: Secondary | ICD-10-CM

## 2019-12-17 DIAGNOSIS — Z3A32 32 weeks gestation of pregnancy: Secondary | ICD-10-CM

## 2019-12-17 DIAGNOSIS — D75839 Thrombocytosis, unspecified: Secondary | ICD-10-CM

## 2019-12-17 DIAGNOSIS — O99712 Diseases of the skin and subcutaneous tissue complicating pregnancy, second trimester: Secondary | ICD-10-CM

## 2019-12-17 DIAGNOSIS — D508 Other iron deficiency anemias: Secondary | ICD-10-CM

## 2019-12-17 DIAGNOSIS — L299 Pruritus, unspecified: Secondary | ICD-10-CM

## 2019-12-17 DIAGNOSIS — O0993 Supervision of high risk pregnancy, unspecified, third trimester: Secondary | ICD-10-CM

## 2019-12-17 MED ORDER — PREDNISONE 10 MG (21) PO TBPK: ORAL_TABLET | ORAL | 0 refills | Status: DC

## 2019-12-17 NOTE — Patient Instructions (Signed)
[place], [place]  [facility] [place] [facility] [address] [phone]  [facility] Main: [address] [phone] [place]: [place] [address] [place], [place]  [place] [address], [place]  [facility] [place] [address], [address], [place]: [address] [place]: [place], [address] [place] [id number] [place]: [address] [phone] [place]: [address] [phone]  [facility] [address] [id number]  [facility] [address], [place] [facility] [place], [place]  [facility] [address], [place], [place]  [place], [place]  [facility] [place], [place]  [facility] [address], [place], [place] [phone]  [facility]) [place] @ [place]: [address] [place] @ [place]: [address] [place] [phone] Peds @ [address]: [address], [place] Peds @ [place]: [place], [place]  [facility] [address] [place], [place] [phone]  [facility] [address], [address], [place] [phone]  Private Pediatrician Baltazar Najjar, MD [address] [place], [place] [phone] Saddie Benders, MD [address] [place], [place] Karleen Dolphin, MD [phone] Melody [place], [place] [facility]) [address], [place]

## 2019-12-17 NOTE — Progress Notes (Signed)
Pt states she is having ?allergic reaction again- she has rash and cough.  Pt states she may be allergic to paint. Pt states she has tried Benadryl with no relief of symptoms.

## 2019-12-17 NOTE — Progress Notes (Signed)
   TELEHEALTH OBSTETRICS PRENATAL VIRTUAL VIDEO VISIT ENCOUNTER NOTE  Provider location: [facility] for [place] [facility] [place]   I connected with [name] on 12/17/19 at 10:30 AM EST by OB MyChart Video Encounter at home and verified that I am speaking with the correct person using two identifiers.   I discussed the limitations, risks, security and privacy concerns of performing an evaluation and management service virtually and the availability of in person appointments. I also discussed with the patient that there may be a patient responsible charge related to this service. The patient expressed understanding and agreed to proceed.  Subjective:  Erin Torres is a 29 y.o. [phone] at [redacted]w[redacted]d being seen today for ongoing prenatal care.  She is currently monitored for the following issues for this high-risk pregnancy and has Hx of migraines; Supervision of other normal pregnancy, antepartum; and Thrombocytosis ([place]) on their problem list.  Patient reports rash that she thinks is a reaction to paint.  Contractions: Not present. Vag. Bleeding: None.  Movement: Present. Denies any leaking of fluid.   The following portions of the patient's history were reviewed and updated as appropriate: allergies, current medications, past family history, past medical history, past social history, past surgical history and problem list.   Objective:  There were no vitals filed for this visit.  Fetal Status:     Movement: Present     General:  Alert, oriented and cooperative. Patient is in no acute distress.  Respiratory: Normal respiratory effort, no problems with respiration noted  Mental Status: Normal mood and affect. Normal behavior. Normal judgment and thought content.  Rest of physical exam deferred due to type of encounter  Imaging: No results found.  Assessment and Plan:  Pregnancy: [name] at [redacted]w[redacted]d 1. Supervision of high risk pregnancy, antepartum  2. Pruritus gravidarum,  third trimester Rx: - predniSONE (STERAPRED UNI-PAK 21 TAB) 10 MG (21) TBPK tablet; Take as directed.  Dispense: 21 tablet; Refill: 0  3. Thrombocytosis (HCC) - platelet count normal on repeat ( 344 K )  4. Iron deficiency anemia secondary to inadequate dietary iron intake - taking iron  5. Hx of migraines - clinically stable  Preterm labor symptoms and general obstetric precautions including but not limited to vaginal bleeding, contractions, leaking of fluid and fetal movement were reviewed in detail with the patient. I discussed the assessment and treatment plan with the patient. The patient was provided an opportunity to ask questions and all were answered. The patient agreed with the plan and demonstrated an understanding of the instructions. The patient was advised to call back or seek an in-person office evaluation/go to MAU at [facility] for any urgent or concerning symptoms. Please refer to After Visit Summary for other counseling recommendations.   I provided 10 minutes of face-to-face time during this encounter.  Return in about 2 weeks (around 12/31/2019) for MyChart.   Baltazar Najjar, MD [facility] for [facility] [place] [facility] 12/17/2019

## 2019-12-31 ENCOUNTER — Encounter: Payer: Self-pay | Admitting: Obstetrics

## 2019-12-31 ENCOUNTER — Telehealth (INDEPENDENT_AMBULATORY_CARE_PROVIDER_SITE_OTHER): Payer: Medicaid Other | Admitting: Obstetrics

## 2019-12-31 DIAGNOSIS — O99712 Diseases of the skin and subcutaneous tissue complicating pregnancy, second trimester: Secondary | ICD-10-CM

## 2019-12-31 DIAGNOSIS — Z3A34 34 weeks gestation of pregnancy: Secondary | ICD-10-CM

## 2019-12-31 DIAGNOSIS — D508 Other iron deficiency anemias: Secondary | ICD-10-CM

## 2019-12-31 DIAGNOSIS — L299 Pruritus, unspecified: Secondary | ICD-10-CM

## 2019-12-31 DIAGNOSIS — O0990 Supervision of high risk pregnancy, unspecified, unspecified trimester: Secondary | ICD-10-CM

## 2019-12-31 DIAGNOSIS — O99013 Anemia complicating pregnancy, third trimester: Secondary | ICD-10-CM

## 2019-12-31 DIAGNOSIS — Z8669 Personal history of other diseases of the nervous system and sense organs: Secondary | ICD-10-CM

## 2019-12-31 MED ORDER — URSODIOL 300 MG PO CAPS: 300.0000 mg | ORAL_CAPSULE | Freq: Two times a day (BID) | ORAL | 4 refills | Status: DC

## 2019-12-31 NOTE — Addendum Note (Signed)
Addended by: Baltazar Najjar A on: 12/31/2019 02:11 PM   Modules accepted: Orders, Level of Service

## 2019-12-31 NOTE — Progress Notes (Addendum)
Patient did not answer nurse's phone call. Patient called later and asked to come in for labs and be seen in person.  Shelly Bombard, MD 12/31/2019 2:20 PM

## 2019-12-31 NOTE — Progress Notes (Addendum)
   IN PERSON OBSTETRICS VISIT ENCOUNTER NOTE   I connected with Erin Torres on 12/31/19 at 10:45 AM EDT by telephone at home and verified that I am speaking with the correct person using two identifiers.   I discussed the limitations, risks, security and privacy concerns of performing an evaluation and management service by telephone and the availability of in person appointments. I also discussed with the patient that there may be a patient responsible charge related to this service. The patient expressed understanding and agreed to proceed.  Subjective:  Erin Torres is a 29 y.o. [name] at [redacted]w[redacted]d being followed for ongoing prenatal care.  She is currently monitored for the following issues for this high-risk pregnancy and has Hx of migraines; Supervision of other normal pregnancy, antepartum; and Thrombocytosis ([place]) on their problem list.  Patient reports severe itching of abdomen and arms. Reports fetal movement.  Was Rx steroid taper but stopped taking because of GI side effects.  Denies any contractions, bleeding or leaking of fluid.   The following portions of the patient's history were reviewed and updated as appropriate: allergies, current medications, past family history, past medical history, past social history, past surgical history and problem list.   Objective:   General:  Alert, oriented and cooperative.   Mental Status: Normal mood and affect perceived. Normal judgment and thought content.          Lungs:  Clear         Heart:  RRR  Assessment and Plan:  Pregnancy: [name] at [redacted]w[redacted]d 1. Supervision of high risk pregnancy, antepartum  2. Pruritus gravidarum, second trimester.     R/O Cholestasis Rx: - Comprehensive metabolic panel - Bile acids, total - Actigall 300 mg po BID  3. Iron deficiency anemia secondary to inadequate dietary iron intake - taking PNV's and Iron  4. Hx of migraines - clinically stable   Preterm labor symptoms and general  obstetric precautions including but not limited to vaginal bleeding, contractions, leaking of fluid and fetal movement were reviewed in detail with the patient.  I discussed the assessment and treatment plan with the patient. The patient was provided an opportunity to ask questions and all were answered. The patient agreed with the plan and demonstrated an understanding of the instructions. The patient was advised to call back or seek an in-person office evaluation/go to [facility]U at [facility] for any urgent or concerning symptoms. Please refer to After Visit Summary for other counseling recommendations.   I provided 10 minutes of non-face-to-face time during this encounter.  Follow up in 2 weeks  Erin Torres, [place] [facility] [place] [facility] 12/31/2019

## 2019-12-31 NOTE — Addendum Note (Signed)
Addended by: Baltazar Najjar A on: 12/31/2019 12:07 PM   Modules accepted: Orders

## 2020-01-02 ENCOUNTER — Other Ambulatory Visit: Payer: Self-pay | Admitting: Obstetrics

## 2020-01-02 DIAGNOSIS — D508 Other iron deficiency anemias: Secondary | ICD-10-CM

## 2020-01-02 DIAGNOSIS — L2089 Other atopic dermatitis: Secondary | ICD-10-CM

## 2020-01-02 LAB — COMPREHENSIVE METABOLIC PANEL
ALT: 9 IU/L (ref 0–32)
AST: 17 IU/L (ref 0–40)
Albumin/Globulin Ratio: 1.1 — ABNORMAL LOW (ref 1.2–2.2)
Albumin: 3.3 g/dL — ABNORMAL LOW (ref 3.9–5.0)
Alkaline Phosphatase: 132 IU/L — ABNORMAL HIGH (ref 39–117)
BUN/Creatinine Ratio: 7 — ABNORMAL LOW (ref 9–23)
BUN: 5 mg/dL — ABNORMAL LOW (ref 6–20)
Bilirubin Total: 0.3 mg/dL (ref 0.0–1.2)
CO2: 23 mmol/L (ref 20–29)
Calcium: 8.8 mg/dL (ref 8.7–10.2)
Chloride: 104 mmol/L (ref 96–106)
Creatinine, Ser: 0.68 mg/dL (ref 0.57–1.00)
GFR calc Af Amer: 136 mL/min/{1.73_m2} (ref >59)
GFR calc non Af Amer: 118 mL/min/{1.73_m2} (ref >59)
Globulin, Total: 2.9 g/dL (ref 1.5–4.5)
Glucose: 79 mg/dL (ref 65–99)
Potassium: 4 mmol/L (ref 3.5–5.2)
Sodium: 137 mmol/L (ref 134–144)
Total Protein: 6.2 g/dL (ref 6.0–8.5)

## 2020-01-02 LAB — BILE ACIDS, TOTAL: Bile Acids Total: 3.7 umol/L (ref 0.0–10.0)

## 2020-01-02 MED ORDER — FERROUS SULFATE 325 (65 FE) MG PO TABS: 325.0000 mg | ORAL_TABLET | Freq: Two times a day (BID) | ORAL | 5 refills | Status: DC

## 2020-01-02 MED ORDER — PRENATE MINI 29-0.6-0.4-350 MG PO CAPS: 1.0000 | ORAL_CAPSULE | Freq: Every day | ORAL | 4 refills | Status: DC

## 2020-01-02 MED ORDER — PIMECROLIMUS 1 % EX CREA: TOPICAL_CREAM | Freq: Two times a day (BID) | CUTANEOUS | 2 refills | Status: DC

## 2020-01-03 ENCOUNTER — Other Ambulatory Visit: Payer: Self-pay | Admitting: *Deleted

## 2020-01-03 ENCOUNTER — Other Ambulatory Visit: Payer: Self-pay | Admitting: Obstetrics

## 2020-01-03 DIAGNOSIS — L2089 Other atopic dermatitis: Secondary | ICD-10-CM

## 2020-01-03 DIAGNOSIS — R21 Rash and other nonspecific skin eruption: Secondary | ICD-10-CM

## 2020-01-03 MED ORDER — FLUOCINONIDE 0.05 % EX OINT: 1.0000 "application " | TOPICAL_OINTMENT | Freq: Two times a day (BID) | CUTANEOUS | 1 refills | Status: DC

## 2020-01-03 NOTE — Progress Notes (Signed)
error 

## 2020-01-14 ENCOUNTER — Encounter: Payer: No Typology Code available for payment source | Admitting: Obstetrics

## 2020-01-16 ENCOUNTER — Other Ambulatory Visit: Payer: Self-pay

## 2020-01-16 ENCOUNTER — Encounter: Payer: Self-pay | Admitting: Obstetrics

## 2020-01-16 ENCOUNTER — Ambulatory Visit (INDEPENDENT_AMBULATORY_CARE_PROVIDER_SITE_OTHER): Payer: Medicaid Other | Admitting: Obstetrics

## 2020-01-16 ENCOUNTER — Other Ambulatory Visit (HOSPITAL_COMMUNITY)
Admission: RE | Admit: 2020-01-16 | Discharge: 2020-01-16 | Disposition: A | Discharge status: Home / Self Care | Payer: Medicaid Other | Source: Ambulatory Visit | Attending: Obstetrics | Admitting: Obstetrics

## 2020-01-16 VITALS — BP 104/68 | HR 87 | Wt 147.4 lb

## 2020-01-16 DIAGNOSIS — Z8669 Personal history of other diseases of the nervous system and sense organs: Secondary | ICD-10-CM

## 2020-01-16 DIAGNOSIS — Z3480 Encounter for supervision of other normal pregnancy, unspecified trimester: Secondary | ICD-10-CM | POA: Diagnosis present

## 2020-01-16 DIAGNOSIS — L2089 Other atopic dermatitis: Secondary | ICD-10-CM

## 2020-01-16 DIAGNOSIS — D473 Essential (hemorrhagic) thrombocythemia: Secondary | ICD-10-CM

## 2020-01-16 DIAGNOSIS — Z3A36 36 weeks gestation of pregnancy: Secondary | ICD-10-CM

## 2020-01-16 DIAGNOSIS — O99713 Diseases of the skin and subcutaneous tissue complicating pregnancy, third trimester: Secondary | ICD-10-CM

## 2020-01-16 DIAGNOSIS — O99013 Anemia complicating pregnancy, third trimester: Secondary | ICD-10-CM

## 2020-01-16 NOTE — Progress Notes (Signed)
Patient reports fetal movement, denies pain. 

## 2020-01-16 NOTE — Progress Notes (Signed)
Subjective:  Erin Torres is a 29 y.o. [phone] at [redacted]w[redacted]d being seen today for ongoing prenatal care.  She is currently monitored for the following issues for this low-risk pregnancy and has Hx of migraines; Supervision of other normal pregnancy, antepartum; and Thrombocytosis (Erin Torres) on their problem list.  Patient reports no complaints.  Contractions: Not present. Vag. Bleeding: None.  Movement: Present. Denies leaking of fluid.   The following portions of the patient's history were reviewed and updated as appropriate: allergies, current medications, past family history, past medical history, past social history, past surgical history and problem list. Problem list updated.  Objective:   Vitals:   01/16/20 1116  BP: 104/68  Pulse: 87  Weight: 147 lb 6.4 oz (66.9 kg)    Fetal Status:     Movement: Present     General:  Alert, oriented and cooperative. Patient is in no acute distress.  Skin: Skin is warm and dry. No rash noted.   Cardiovascular: Normal heart rate noted  Respiratory: Normal respiratory effort, no problems with respiration noted  Abdomen: Soft, gravid, appropriate for gestational age. Pain/Pressure: Absent     Pelvic:  Cervical exam deferred        Extremities: Normal range of motion.  Edema: None  Mental Status: Normal mood and affect. Normal behavior. Normal judgment and thought content.   Urinalysis:      Assessment and Plan:  Pregnancy: [name] at [redacted]w[redacted]d  1. Supervision of other normal pregnancy, antepartum Rx: - Strep Gp B NAA - Cervicovaginal ancillary only( )  2. Hx of migraines - clinically stable  3. Other atopic dermatitis - improved   Preterm labor symptoms and general obstetric precautions including but not limited to vaginal bleeding, contractions, leaking of fluid and fetal movement were reviewed in detail with the patient. Please refer to After Visit Summary for other counseling recommendations.   Return in about 1 week (around  01/23/2020) for MyChart.   Shelly Bombard, MD  01/16/2020

## 2020-01-17 LAB — CERVICOVAGINAL ANCILLARY ONLY
Chlamydia: NEGATIVE
Comment: NEGATIVE
Comment: NORMAL
Neisseria Gonorrhea: NEGATIVE

## 2020-01-18 LAB — STREP GP B NAA: Strep Gp B NAA: NEGATIVE

## 2020-01-24 ENCOUNTER — Encounter: Payer: Self-pay | Admitting: Obstetrics

## 2020-01-24 ENCOUNTER — Telehealth (INDEPENDENT_AMBULATORY_CARE_PROVIDER_SITE_OTHER): Payer: No Typology Code available for payment source | Admitting: Obstetrics

## 2020-01-24 DIAGNOSIS — Z3480 Encounter for supervision of other normal pregnancy, unspecified trimester: Secondary | ICD-10-CM

## 2020-01-24 NOTE — Progress Notes (Signed)
Patient did not answer nurse's phone call

## 2020-02-11 ENCOUNTER — Encounter: Payer: Self-pay | Admitting: Obstetrics

## 2020-02-11 ENCOUNTER — Other Ambulatory Visit: Payer: Self-pay

## 2020-02-11 ENCOUNTER — Telehealth (HOSPITAL_COMMUNITY): Payer: Self-pay | Admitting: *Deleted

## 2020-02-11 ENCOUNTER — Ambulatory Visit (INDEPENDENT_AMBULATORY_CARE_PROVIDER_SITE_OTHER): Payer: Medicaid Other | Admitting: Obstetrics

## 2020-02-11 ENCOUNTER — Encounter (HOSPITAL_COMMUNITY): Payer: Self-pay | Admitting: *Deleted

## 2020-02-11 VITALS — BP 112/65 | HR 84 | Wt 153.0 lb

## 2020-02-11 DIAGNOSIS — O99013 Anemia complicating pregnancy, third trimester: Secondary | ICD-10-CM | POA: Diagnosis not present

## 2020-02-11 DIAGNOSIS — D508 Other iron deficiency anemias: Secondary | ICD-10-CM | POA: Diagnosis not present

## 2020-02-11 DIAGNOSIS — O480 Post-term pregnancy: Secondary | ICD-10-CM | POA: Diagnosis not present

## 2020-02-11 DIAGNOSIS — Z8669 Personal history of other diseases of the nervous system and sense organs: Secondary | ICD-10-CM

## 2020-02-11 DIAGNOSIS — D75839 Thrombocytosis, unspecified: Secondary | ICD-10-CM

## 2020-02-11 DIAGNOSIS — Z3480 Encounter for supervision of other normal pregnancy, unspecified trimester: Secondary | ICD-10-CM

## 2020-02-11 DIAGNOSIS — Z3A40 40 weeks gestation of pregnancy: Secondary | ICD-10-CM | POA: Diagnosis not present

## 2020-02-11 DIAGNOSIS — D473 Essential (hemorrhagic) thrombocythemia: Secondary | ICD-10-CM

## 2020-02-11 DIAGNOSIS — Z3009 Encounter for other general counseling and advice on contraception: Secondary | ICD-10-CM

## 2020-02-11 NOTE — Progress Notes (Signed)
Subjective:  Erin Torres is a 29 y.o. [phone] at [redacted]w[redacted]d being seen today for ongoing prenatal care.  She is currently monitored for the following issues for this low-risk pregnancy and has Hx of migraines; Supervision of other normal pregnancy, antepartum; and Thrombocytosis ([place]) on their problem list.  Patient reports no complaints.  Contractions: Not present. Vag. Bleeding: None.  Movement: Present. Denies leaking of fluid.   The following portions of the patient's history were reviewed and updated as appropriate: allergies, current medications, past family history, past medical history, past social history, past surgical history and problem list. Problem list updated.  Objective:   Vitals:   02/11/20 1402  BP: 112/65  Pulse: 84  Weight: 153 lb (69.4 kg)    Fetal Status:     Movement: Present     General:  Alert, oriented and cooperative. Patient is in no acute distress.  Skin: Skin is warm and dry. No rash noted.   Cardiovascular: Normal heart rate noted  Respiratory: Normal respiratory effort, no problems with respiration noted  Abdomen: Soft, gravid, appropriate for gestational age. Pain/Pressure: Absent     Pelvic:  Cervical exam deferred        Extremities: Normal range of motion.     Mental Status: Normal mood and affect. Normal behavior. Normal judgment and thought content.   Urinalysis:      Assessment and Plan:  Pregnancy: [id number] at [redacted]w[redacted]d  1. Supervision of other normal pregnancy, antepartum  2. Hx of migraines - clinically stable  3. Iron deficiency anemia secondary to inadequate dietary iron intake - clinically stable  4. Thrombocytosis (HCC) - RESOLVED  5. Contraceptive Management - WANTS POST-PLACENTAL IUD   Term labor symptoms and general obstetric precautions including but not limited to vaginal bleeding, contractions, leaking of fluid and fetal movement were reviewed in detail with the patient. Please refer to After Visit Summary for other  counseling recommendations.   Return in about 4 weeks (around 03/10/2020) for postpartum visit.   Shelly Bombard, MD  02/11/2020

## 2020-02-11 NOTE — Telephone Encounter (Signed)
Preadmission screen  

## 2020-02-12 ENCOUNTER — Other Ambulatory Visit: Payer: Self-pay | Admitting: Advanced Practice Midwife

## 2020-02-12 ENCOUNTER — Other Ambulatory Visit (HOSPITAL_COMMUNITY): Payer: No Typology Code available for payment source

## 2020-02-12 ENCOUNTER — Other Ambulatory Visit (HOSPITAL_COMMUNITY)
Admission: RE | Admit: 2020-02-12 | Discharge: 2020-02-12 | Disposition: A | Discharge status: Home / Self Care | Payer: Medicaid Other | Source: Ambulatory Visit | Attending: Family Medicine | Admitting: Family Medicine

## 2020-02-12 DIAGNOSIS — Z20822 Contact with and (suspected) exposure to covid-19: Secondary | ICD-10-CM | POA: Insufficient documentation

## 2020-02-12 DIAGNOSIS — Z01812 Encounter for preprocedural laboratory examination: Secondary | ICD-10-CM | POA: Diagnosis not present

## 2020-02-12 LAB — CBC
Hematocrit: 26.7 % — ABNORMAL LOW (ref 34.0–46.6)
Hemoglobin: 8.7 g/dL — ABNORMAL LOW (ref 11.1–15.9)
MCH: 26.6 pg (ref 26.6–33.0)
MCHC: 32.6 g/dL (ref 31.5–35.7)
MCV: 82 fL (ref 79–97)
Platelets: 389 10*3/uL (ref 150–450)
RBC: 3.27 x10E6/uL — ABNORMAL LOW (ref 3.77–5.28)
RDW: 14.7 % (ref 11.7–15.4)
WBC: 7.4 10*3/uL (ref 3.4–10.8)

## 2020-02-12 LAB — SARS CORONAVIRUS 2 (TAT 6-24 HRS): SARS Coronavirus 2: NEGATIVE

## 2020-02-14 ENCOUNTER — Encounter (HOSPITAL_COMMUNITY): Payer: Self-pay | Admitting: Obstetrics & Gynecology

## 2020-02-14 ENCOUNTER — Inpatient Hospital Stay (HOSPITAL_COMMUNITY)
Admission: AD | Admit: 2020-02-14 | Discharge: 2020-02-17 | DRG: 807 | Disposition: A | Discharge status: Home / Self Care | Payer: Medicaid Other | Attending: Obstetrics & Gynecology | Admitting: Obstetrics & Gynecology

## 2020-02-14 ENCOUNTER — Inpatient Hospital Stay (HOSPITAL_COMMUNITY): Payer: Medicaid Other

## 2020-02-14 ENCOUNTER — Other Ambulatory Visit: Payer: Self-pay

## 2020-02-14 DIAGNOSIS — O9902 Anemia complicating childbirth: Secondary | ICD-10-CM | POA: Diagnosis present

## 2020-02-14 DIAGNOSIS — O480 Post-term pregnancy: Secondary | ICD-10-CM | POA: Diagnosis present

## 2020-02-14 DIAGNOSIS — Z3A41 41 weeks gestation of pregnancy: Secondary | ICD-10-CM | POA: Diagnosis not present

## 2020-02-14 DIAGNOSIS — D509 Iron deficiency anemia, unspecified: Secondary | ICD-10-CM | POA: Diagnosis present

## 2020-02-14 LAB — CBC
HCT: 29.4 % — ABNORMAL LOW (ref 36.0–46.0)
Hemoglobin: 9.1 g/dL — ABNORMAL LOW (ref 12.0–15.0)
MCH: 26 pg (ref 26.0–34.0)
MCHC: 31 g/dL (ref 30.0–36.0)
MCV: 84 fL (ref 80.0–100.0)
Platelets: 408 10*3/uL — ABNORMAL HIGH (ref 150–400)
RBC: 3.5 MIL/uL — ABNORMAL LOW (ref 3.87–5.11)
RDW: 15.5 % (ref 11.5–15.5)
WBC: 5.4 10*3/uL (ref 4.0–10.5)
nRBC: 0 % (ref 0.0–0.2)

## 2020-02-14 LAB — TYPE AND SCREEN
ABO/RH(D): A POS
Antibody Screen: NEGATIVE

## 2020-02-14 LAB — ABO/RH: ABO/RH(D): A POS

## 2020-02-14 MED ORDER — ONDANSETRON HCL 4 MG/2ML IJ SOLN: 4.0000 mg | Freq: Four times a day (QID) | INTRAMUSCULAR | Status: DC | PRN

## 2020-02-14 MED ORDER — MISOPROSTOL 25 MCG QUARTER TABLET
25.0000 ug | ORAL_TABLET | ORAL | Status: DC | PRN
  Administered 2020-02-14: 25 ug via VAGINAL
  Filled 2020-02-14: qty 1

## 2020-02-14 MED ORDER — TERBUTALINE SULFATE 1 MG/ML IJ SOLN: 0.2500 mg | Freq: Once | INTRAMUSCULAR | Status: DC | PRN

## 2020-02-14 MED ORDER — OXYTOCIN 40 UNITS IN NORMAL SALINE INFUSION - SIMPLE MED
2.5000 [IU]/h | INTRAVENOUS | Status: DC
  Filled 2020-02-14: qty 1000

## 2020-02-14 MED ORDER — OXYCODONE-ACETAMINOPHEN 5-325 MG PO TABS: 1.0000 | ORAL_TABLET | ORAL | Status: DC | PRN

## 2020-02-14 MED ORDER — FENTANYL CITRATE (PF) 100 MCG/2ML IJ SOLN
100.0000 ug | INTRAMUSCULAR | Status: DC | PRN
  Administered 2020-02-15: 100 ug via INTRAVENOUS
  Filled 2020-02-14 (×2): qty 2

## 2020-02-14 MED ORDER — LACTATED RINGERS IV SOLN: 500.0000 mL | INTRAVENOUS | Status: DC | PRN

## 2020-02-14 MED ORDER — OXYTOCIN 40 UNITS IN NORMAL SALINE INFUSION - SIMPLE MED: 1.0000 m[IU]/min | INTRAVENOUS | Status: DC

## 2020-02-14 MED ORDER — OXYTOCIN 40 UNITS IN NORMAL SALINE INFUSION - SIMPLE MED
1.0000 m[IU]/min | INTRAVENOUS | Status: DC
  Administered 2020-02-14: 2 m[IU]/min via INTRAVENOUS

## 2020-02-14 MED ORDER — ACETAMINOPHEN 325 MG PO TABS: 650.0000 mg | ORAL_TABLET | ORAL | Status: DC | PRN

## 2020-02-14 MED ORDER — LIDOCAINE HCL (PF) 1 % IJ SOLN: 30.0000 mL | INTRAMUSCULAR | Status: DC | PRN

## 2020-02-14 MED ORDER — LACTATED RINGERS IV SOLN: INTRAVENOUS | Status: DC

## 2020-02-14 MED ORDER — OXYTOCIN BOLUS FROM INFUSION
500.0000 mL | Freq: Once | INTRAVENOUS | Status: AC
  Administered 2020-02-15: 500 mL via INTRAVENOUS

## 2020-02-14 MED ORDER — SOD CITRATE-CITRIC ACID 500-334 MG/5ML PO SOLN: 30.0000 mL | ORAL | Status: DC | PRN

## 2020-02-14 MED ORDER — OXYCODONE-ACETAMINOPHEN 5-325 MG PO TABS: 2.0000 | ORAL_TABLET | ORAL | Status: DC | PRN

## 2020-02-14 NOTE — Progress Notes (Signed)
Labor Progress Note Erin Torres is a 29 y.o. [id number] at [redacted]w[redacted]d presented for IOL for postdates    S: Patient feeling CTXs s/p foley bulb coming out.   O:  BP 132/63   Pulse 77   Temp 98 F (36.7 C) (Oral)   Resp 18   Ht 5\' 6"  (1.676 m)   Wt 69.4 kg   LMP 04/24/2019 (Within Months)   BMI 24.69 kg/m  EFM: 130 / mod variability / pos accels, no decels  TOCO: 2-4 minutes   CVE: Dilation: 3.5 Effacement (%): 70 Station: -1 Presentation: Vertex Exam by:: Dr. Susa Simmonds   A&P: 29 y.o. [id number] [redacted]w[redacted]d admitted for IOL for PD.  #Labor: Progressing. Cytotec x1 and foley bulb.  Pitocin started.  Reassess in 3-4 hours.  #Pain: per patient request, does not want an epidural #FWB: Cat 1  #GBS negative   Erin Deihl, DO 6:54 PM

## 2020-02-14 NOTE — Progress Notes (Addendum)
Labor Progress Note Erin Torres is a 29 y.o. [id number] at [redacted]w[redacted]d presented for IOL for postdates    S: Patient comfortable and is tolerating contractions well. Wishes to avoid.   O:  BP 112/60   Pulse 87   Temp 98 F (36.7 C) (Oral)   Resp 18   LMP 04/24/2019 (Within Months)  EFM: 135/moderate variability / pos accels, no decels  TOCO: every 2-3 minutes   CVE: Dilation: 2 Effacement (%): 60, 70 Station: -2 Presentation: Vertex Exam by:: Jeanann Lewandowsky RN   A&P: 29 y.o. [id number] [redacted]w[redacted]d admitted for IOL for PD.  #Labor: Progressing. Cytotec x1, Foley bulb placed and patient agreeable. FB placed successfully by Dr. Darene Lamer. Reassess after Foley bulb out or in ~4 hours.  #Pain: per patient request, does not want an epidural #FWB: Cat 1  #GBS negative     Lyndee Hensen, DO 4:37 PM

## 2020-02-14 NOTE — Progress Notes (Signed)
Labor Progress Note Erin Torres is a 29 y.o. [phone] at [redacted]w[redacted]d presented for IOL for postdates    S: Starting to feel contractions but not very strong yet   O:  BP 108/63   Pulse 77   Temp 97.6 F (36.4 C) (Oral)   Resp 18   Ht 5\' 6"  (1.676 m)   Wt 69.4 kg   LMP 04/24/2019 (Within Months)   BMI 24.69 kg/m  EFM: 130 / mod variability / pos accels, no decels  TOCO: 2-4 minutes   CVE: Dilation: 4 Effacement (%): 80 Cervical Position: Posterior Station: -2 Presentation: Vertex Exam by:: Dr.Ekstat     A&P: 29 y.o. [name] [redacted]w[redacted]d admitted for IOL for PD.  #Labor: S/p Cytotec x1 and foley bulb.  Pitocin started at 1900. Discussed AROM but patient very uncomfortable w exam, would like to wait an hour and then try again +/- w IV fentanyl.  #Pain: per patient request, does not want an epidural #FWB: Cat I #GBS negative   Clarnce Flock, MD 11:08 PM

## 2020-02-14 NOTE — H&P (Addendum)
Erin Torres is a 29 y.o. female [phone] with IUP at [place] for postdates. She reports +FMs, No LOF, no VB, no blurry vision, headaches or peripheral edema, and RUQ pain.  She plans on breast feed feeding. She request Depo for birth control. [place] [facility] [place]  Estimated Date of Delivery: 02/07/20  Sono:    '@[redacted]w[redacted]d' , CWD, normal anatomy, variable presentation, EFW: 248  gm  (0 lb 9 oz), 56% lie   Prenatal History/Complications:  Iron Deficiency Anemia (oral Fe) Thrombocytosis  Pruritus gravidarum  Migraines     Past Medical History: Past Medical History:  Diagnosis Date  . Anemia   . Medical history non-contributory   . No pertinent past medical history     Past Surgical History: Past Surgical History:  Procedure Laterality Date  . NO PAST SURGERIES      Obstetrical History: OB History    Gravida  5   Para  3   Term  3   Preterm      AB  1   Living  3     SAB  1   TAB      Ectopic      Multiple  0   Live Births  3           Social History Social History   Socioeconomic History  . Marital status: Single    Spouse name: Not on file  . Number of children: Not on file  . Years of education: Not on file  . Highest education level: Not on file  Occupational History  . Not on file  Tobacco Use  . Smoking status: Never Smoker  . Smokeless tobacco: Never Used  Substance and Sexual Activity  . Alcohol use: No    Alcohol/week: 0.0 standard drinks  . Drug use: No  . Sexual activity: Yes    Partners: Male    Birth control/protection: None  Other Topics Concern  . Not on file  Social History Narrative  . Not on file   Social Determinants of Health   Financial Resource Strain:   . Difficulty of Paying Living Expenses:   Food Insecurity:   . Worried About RCharity fundraiserin the Last Year:   . RArboriculturistin the Last  Year:   Transportation Needs:   . LFilm/video editor(Medical):   .Marland KitchenLack of Transportation (Non-Medical):   Physical Activity:   . Days of Exercise per Week:   . Minutes of Exercise per Session:   Stress:   . Feeling of Stress :   Social Connections:   . Frequency of Communication with Friends and Family:   . Frequency of Social Gatherings with Friends and Family:   . Attends Religious Services:   . Active Member of Clubs or Organizations:   . Attends CArchivistMeetings:   .Marland KitchenMarital Status:     Family History: Family History  Problem Relation Age of Onset  . Hypertension Maternal Grandmother   . Hypertension Paternal Grandmother   . Diabetes Paternal Grandmother   . Anesthesia problems Neg Hx     Allergies: No Known Allergies  Medications Prior to Admission  Medication Sig Dispense Refill Last Dose  . Blood Pressure Monitoring (BLOOD PRESSURE KIT) DEVI 1 Device by Does not apply route as needed. Check BP regularly and record readings into the  [employer] App.  Large Cuff. (Patient not taking: Reported on 01/16/2020) 1 each 0   . cephALEXin (KEFLEX) 500 MG capsule Take 1 capsule (500 mg total) by mouth 2 (two) times daily. (Patient not taking: Reported on 08/15/2019) 10 capsule 0   . Doxylamine-Pyridoxine (DICLEGIS) 10-10 MG TBEC Take 2 tablets by mouth at bedtime. If symptoms persist, add one tablet in the morning and one in the afternoon (Patient not taking: Reported on 07/18/2019) 100 tablet 5   . ferrous sulfate 325 (65 FE) MG tablet Take 1 tablet (325 mg total) by mouth 2 (two) times daily with a meal. 60 tablet 5   . fluocinonide ointment (LIDEX) 9.76 % Apply 1 application topically 2 (two) times daily. (Patient not taking: Reported on 01/16/2020) 60 g 1   . pimecrolimus (ELIDEL) 1 % cream Apply topically 2 (two) times daily. (Patient not taking: Reported on 01/16/2020) 100 g 2   . predniSONE (STERAPRED UNI-PAK 21 TAB) 10 MG (21) TBPK tablet Take as directed.  (Patient not taking: Reported on 01/16/2020) 21 tablet 0   . Prenat w/o A-FeCbn-Meth-FA-DHA (PRENATE MINI) 29-0.6-0.4-350 MG CAPS Take 1 capsule by mouth daily before breakfast. 90 capsule 4   . Prenatal Vit-Fe Fumarate-FA (PRENATAL MULTIVITAMIN) TABS tablet Take 1 tablet by mouth daily at 12 noon.     . Prenatal Vit-Fe Phos-FA-Omega (VITAFOL GUMMIES) 3.33-0.333-34.8 MG CHEW Chew 1 tablet by mouth daily. (Patient not taking: Reported on 01/16/2020) 90 tablet 5      Review of Systems   All systems reviewed and negative except as stated in HPI  Blood pressure 116/64, pulse 93, temperature 98.3 F (36.8 C), temperature source Oral, resp. rate 16, last menstrual period 04/24/2019, unknown if currently breastfeeding. General appearance: alert, cooperative and no distress Lungs: clear to auscultation bilaterally Heart: regular rate and rhythm Abdomen: soft, non-tender; bowel sounds normal Pelvic: gravid uterus Extremities: Homans sign is negative, no sign of DVT Presentation: cephalic Fetal monitoringBaseline: 125 bpm, Variability: Good {> 6 bpm), Accelerations: Reactive and Decelerations: Absent Uterine activityFrequency: Every 3-4 minutes     Prenatal labs: ABO, Rh: A/Positive/-- (10/08 0945) Antibody: Negative (10/08 0945) Rubella: 1.99 (10/08 0945) RPR: Non Reactive (02/09 1001)  HBsAg: Negative (10/08 0945)  HIV: Non Reactive (02/09 1001)  GBS: Negative/-- (04/08 0112)  2 hr Glucola (77, 156, 112)  Genetic screening: Low risk NIPS Anatomy US normal  Prenatal Transfer Tool  Maternal Diabetes: No Genetic Screening: Normal Maternal Ultrasounds/Referrals: Normal Fetal Ultrasounds or other Referrals:  None Maternal Substance Abuse:  No Significant Maternal Medications:  None Significant Maternal Lab Results: Group B Strep negative  Results for orders placed or performed during the hospital encounter of 02/14/20 (from the past 24 hour(s))  CBC   Collection Time: 02/14/20 11:43 AM   Result Value Ref Range   WBC 5.4 4.0 - 10.5 K/uL   RBC 3.50 (L) 3.87 - 5.11 MIL/uL   Hemoglobin 9.1 (L) 12.0 - 15.0 g/dL   HCT 29.4 (L) 36.0 - 46.0 %   MCV 84.0 80.0 - 100.0 fL   MCH 26.0 26.0 - 34.0 pg   MCHC 31.0 30.0 - 36.0 g/dL   RDW 15.5 11.5 - 15.5 %   Platelets 408 (H) 150 - 400 K/uL   nRBC 0.0 0.0 - 0.2 %    Patient Active Problem List   Diagnosis Date Noted  . Post term pregnancy at [redacted] weeks gestation 02/14/2020  . Thrombocytosis ([place]) 07/19/2019  . Supervision of other normal pregnancy, antepartum  07/18/2019  . Hx of migraines 12/17/2014    Assessment/Plan:  DELROSE ROHWER is a 29 y.o. [id number] at 54w0dhere for IOL for postdates.   #Labor:Admit to L&D. Discussed induction risks and benefits (Cytotec, FB/Cook catheter, AROM and pitocin). Patient agreeable. Cytotec placed vaginally.  Anticipate vaginal delivery.  #Pain: epidural #FWB: Cat 1  #ID: None, GBS negative #MOF: breast  #MOC:Depo #Circ:  yes  VLyndee Hensen DO  02/14/2020, 11:57 AM   GME ATTESTATION:  I saw and evaluated the patient. I agree with the findings and the plan of care as documented in the resident's note.  HMerilyn Baba DO OB Fellow, [place]5/04/2020 12:48 PM

## 2020-02-15 ENCOUNTER — Encounter (HOSPITAL_COMMUNITY): Payer: Self-pay | Admitting: Obstetrics & Gynecology

## 2020-02-15 DIAGNOSIS — O480 Post-term pregnancy: Principal | ICD-10-CM

## 2020-02-15 DIAGNOSIS — Z3A41 41 weeks gestation of pregnancy: Secondary | ICD-10-CM

## 2020-02-15 LAB — RPR: RPR Ser Ql: NONREACTIVE

## 2020-02-15 MED ORDER — MAGNESIUM HYDROXIDE 400 MG/5ML PO SUSP: 30.0000 mL | ORAL | Status: DC | PRN

## 2020-02-15 MED ORDER — METHYLERGONOVINE MALEATE 0.2 MG/ML IJ SOLN
INTRAMUSCULAR | Status: AC
  Filled 2020-02-15: qty 1

## 2020-02-15 MED ORDER — DIPHENHYDRAMINE HCL 25 MG PO CAPS: 25.0000 mg | ORAL_CAPSULE | Freq: Four times a day (QID) | ORAL | Status: DC | PRN

## 2020-02-15 MED ORDER — FENTANYL CITRATE (PF) 100 MCG/2ML IJ SOLN
50.0000 ug | Freq: Once | INTRAMUSCULAR | Status: AC
  Administered 2020-02-15: 50 ug via INTRAVENOUS

## 2020-02-15 MED ORDER — METHYLERGONOVINE MALEATE 0.2 MG/ML IJ SOLN
0.2000 mg | Freq: Once | INTRAMUSCULAR | Status: AC
  Administered 2020-02-15: 0.2 mg via INTRAMUSCULAR

## 2020-02-15 MED ORDER — BENZOCAINE-MENTHOL 20-0.5 % EX AERO
1.0000 "application " | INHALATION_SPRAY | CUTANEOUS | Status: DC | PRN
  Administered 2020-02-15: 1 via TOPICAL
  Filled 2020-02-15: qty 56

## 2020-02-15 MED ORDER — ACETAMINOPHEN 325 MG PO TABS
650.0000 mg | ORAL_TABLET | ORAL | Status: DC | PRN
  Administered 2020-02-15: 650 mg via ORAL
  Filled 2020-02-15: qty 2

## 2020-02-15 MED ORDER — ONDANSETRON HCL 4 MG/2ML IJ SOLN: 4.0000 mg | INTRAMUSCULAR | Status: DC | PRN

## 2020-02-15 MED ORDER — SIMETHICONE 80 MG PO CHEW: 80.0000 mg | CHEWABLE_TABLET | ORAL | Status: DC | PRN

## 2020-02-15 MED ORDER — PRENATAL MULTIVITAMIN CH
1.0000 | ORAL_TABLET | Freq: Every day | ORAL | Status: DC
  Administered 2020-02-15 – 2020-02-17 (×3): 1 via ORAL
  Filled 2020-02-15 (×3): qty 1

## 2020-02-15 MED ORDER — OXYCODONE HCL 5 MG PO TABS: 10.0000 mg | ORAL_TABLET | ORAL | Status: DC | PRN

## 2020-02-15 MED ORDER — WITCH HAZEL-GLYCERIN EX PADS: 1.0000 "application " | MEDICATED_PAD | CUTANEOUS | Status: DC | PRN

## 2020-02-15 MED ORDER — ONDANSETRON HCL 4 MG PO TABS: 4.0000 mg | ORAL_TABLET | ORAL | Status: DC | PRN

## 2020-02-15 MED ORDER — SENNOSIDES-DOCUSATE SODIUM 8.6-50 MG PO TABS
2.0000 | ORAL_TABLET | ORAL | Status: DC
  Administered 2020-02-15 – 2020-02-17 (×2): 2 via ORAL
  Filled 2020-02-15 (×2): qty 2

## 2020-02-15 MED ORDER — OXYCODONE HCL 5 MG PO TABS: 5.0000 mg | ORAL_TABLET | ORAL | Status: DC | PRN

## 2020-02-15 MED ORDER — IBUPROFEN 600 MG PO TABS
600.0000 mg | ORAL_TABLET | Freq: Four times a day (QID) | ORAL | Status: DC
  Administered 2020-02-15 – 2020-02-17 (×9): 600 mg via ORAL
  Filled 2020-02-15 (×9): qty 1

## 2020-02-15 MED ORDER — DIBUCAINE (PERIANAL) 1 % EX OINT: 1.0000 "application " | TOPICAL_OINTMENT | CUTANEOUS | Status: DC | PRN

## 2020-02-15 MED ORDER — COCONUT OIL OIL: 1.0000 "application " | TOPICAL_OIL | Status: DC | PRN

## 2020-02-15 NOTE — Discharge Summary (Signed)
Postpartum Discharge Summary     Patient Name: Erin Torres DOB: 01/06/1991 MRN: [id number]  Date of admission: 02/14/2020 Delivering Provider: Clarnce Flock   Date of discharge: 02/17/2020  Admitting diagnosis: Post term pregnancy at [redacted] weeks gestation [O48.0, Z3A.41] Intrauterine pregnancy: [redacted]w[redacted]d    Secondary diagnosis:  Active Problems:   NSVD (normal spontaneous vaginal delivery)   Post term pregnancy at [redacted] weeks gestation  Additional problems: None     Discharge diagnosis: Term Pregnancy Delivered                                                                                                Post partum procedures:None  Augmentation: AROM, Pitocin, Cytotec and Foley Balloon  Complications: None  Hospital course:  Induction of Labor With Vaginal Delivery   29y.o. yo [id number] admitted to the hospital 02/14/2020 for induction of labor.  Indication for induction: Postdates.  Patient had an uncomplicated labor course as follows: induced with misoprostol x1, foley bulb, pitocin, and AROM, delivered quickly after AROM.  Membrane Rupture Time/Date: 5:26 AM ,02/15/2020   Intrapartum Procedures: Episiotomy: None [1]                                         Lacerations:  1st degree [2];Perineal [11]  Patient had delivery of a Viable infant.  Information for the patient's newborn:  HaTatumn, Corbridge0[[id number]]Delivery Method: Vag-Spont    02/15/2020  Details of delivery can be found in separate delivery note.  Patient had a routine postpartum course. Patient is discharged home 02/17/20. Delivery time: 6:08 AM    Magnesium Sulfate received: No BMZ received: No Rhophylac:N/A MMR:N/A Transfusion:No  Physical exam  Vitals:   02/16/20 0525 02/16/20 1535 02/16/20 1918 02/16/20 2100  BP: (!) 98/55 (!) 104/59 129/78 (!) 92/53  Pulse: 76 79 84 87  Resp: '15 16 15 18  ' Temp: 98 F (36.7 C) 98.2 F (36.8 C) 98.4 F (36.9 C) 98.2 F (36.8 C)  TempSrc:  Oral Oral Oral Oral  SpO2: 100% 100% 100%   Weight:      Height:       General: alert, cooperative and no distress Lochia: appropriate Uterine Fundus: firm Incision: N/A DVT Evaluation: No evidence of DVT seen on physical exam. Labs: Lab Results  Component Value Date   WBC 5.4 02/14/2020   HGB 9.1 (L) 02/14/2020   HCT 29.4 (L) 02/14/2020   MCV 84.0 02/14/2020   PLT 408 (H) 02/14/2020   CMP Latest Ref Rng & Units 12/31/2019  Glucose 65 - 99 mg/dL 79  BUN 6 - 20 mg/dL 5(L)  Creatinine 0.57 - 1.00 mg/dL 0.68  Sodium 134 - 144 mmol/L 137  Potassium 3.5 - 5.2 mmol/L 4.0  Chloride 96 - 106 mmol/L 104  CO2 20 - 29 mmol/L 23  Calcium 8.7 - 10.2 mg/dL 8.8  Total Protein 6.0 - 8.5 g/dL 6.2  Total Bilirubin 0.0 - 1.2 mg/dL 0.3  Alkaline Phos 39 -  117 IU/L 132(H)  AST 0 - 40 IU/L 17  ALT 0 - 32 IU/L 9   Edinburgh Score: Edinburgh Postnatal Depression Scale Screening Tool 02/16/2020  I have been able to laugh and see the funny side of things. 0  I have looked forward with enjoyment to things. 0  I have blamed myself unnecessarily when things went wrong. 1  I have been anxious or worried for no good reason. 1  I have felt scared or panicky for no good reason. 2  Things have been getting on top of me. 2  I have been so unhappy that I have had difficulty sleeping. 2  I have felt sad or miserable. 1  I have been so unhappy that I have been crying. 1  The thought of harming myself has occurred to me. 0  Edinburgh Postnatal Depression Scale Total 10    Discharge instruction: per After Visit Summary and "Baby and Me Booklet".  After visit meds:  Allergies as of 02/17/2020   No Known Allergies     Medication List    STOP taking these medications   cephALEXin 500 MG capsule Commonly known as: KEFLEX   Doxylamine-Pyridoxine 10-10 MG Tbec Commonly known as: Diclegis   ferrous sulfate 325 (65 FE) MG tablet   fluocinonide ointment 0.05 % Commonly known as: LIDEX   pimecrolimus  1 % cream Commonly known as: Elidel   predniSONE 10 MG (21) Tbpk tablet Commonly known as: STERAPRED UNI-PAK 21 TAB   Vitafol Gummies 3.33-0.333-34.8 MG Chew     TAKE these medications   Blood Pressure Kit Devi 1 Device by Does not apply route as needed. Check BP regularly and record readings into the Babyscripts App.  Large Cuff.   ibuprofen 600 MG tablet Commonly known as: ADVIL Take 1 tablet (600 mg total) by mouth every 6 (six) hours.   Prenate Mini 29-0.6-0.4-350 MG Caps Take 1 capsule by mouth daily before breakfast.       Diet: routine diet  Activity: Advance as tolerated. Pelvic rest for 6 weeks.   Outpatient follow up:6 weeks Follow up Appt:No future appointments. Follow up Visit:   Please schedule this patient for Postpartum visit in: 6 weeks with the following provider: Any provider Virtual For C/S patients schedule nurse incision check in weeks 2 weeks: no Low risk pregnancy complicated by: n/a Delivery mode:  SVD Anticipated Birth Control:  Depo PP Procedures needed: schedule follow up Depo appt  Schedule Integrated BH visit: no   Newborn Data: Live born female  Birth Weight: 7 lb 15.9 oz (3625 g) APGAR: 9, 9  Newborn Delivery   Birth date/time: 02/15/2020 06:08:00 Delivery type: Vaginal, Spontaneous      Baby Feeding: Breast Disposition:home with mother   02/17/2020 Chauncey Mann, MD

## 2020-02-15 NOTE — Progress Notes (Signed)
Labor Progress Note Erin Torres is a 29 y.o. [phone] at [redacted]w[redacted]d presented for IOL for postdates    S: Sleeping  O:  BP 113/68   Pulse 71   Temp 97.6 F (36.4 C) (Oral)   Resp 16   Ht 5\' 6"  (1.676 m)   Wt 69.4 kg   LMP 04/24/2019 (Within Months)   BMI 24.69 kg/m  EFM: 130 / mod variability / pos accels, variable decels  TOCO: poor tracing  CVE: Dilation: 4.5 Effacement (%): 80 Cervical Position: Posterior Station: -2 Presentation: Vertex Exam by:: Dr.Ekstat     A&P: 29 y.o. [name] [redacted]w[redacted]d admitted for IOL for PD.  #Labor: S/p Cytotec x1 and foley bulb.  Pitocin started at 1900. AROM 0515, anticipate NSVD. #Pain: per patient request, does not want an epidural #FWB: Cat II but reassuring for moderate variability and accels #GBS negative   Clarnce Flock, MD 5:44 AM

## 2020-02-16 NOTE — Progress Notes (Signed)
CSW received consult due to score 10 on Edinburgh Depression Screen.    CSW met with MOB at bedside to offer support and complete assessment. Infant Tory was present and in bassinet at time of arrival.  MOB was pleasant and engaged during visit.   During assessment, MOB denied any hx mental health dx or Rx. MOB denied any postpartum depression and/or anxiety history. MOB reported [place] score was related to difficult pregnancy experience and having three other children. MOB explained she was in pain and sick throughout pregnancy. MOB reported FOB and mom were very helpful with children, however, she remained uncomfortable and sick. MOB reported she feels much better since delivery and has been able to sleep well.  MOB denied any SI, HI, or domestic violence. MOB identified FOB and mom as support system and exercise as coping strategy.   CSW provided education regarding the baby blues period vs. perinatal mood disorders, discussed treatment and gave resources for mental health follow up if concerns arise.  CSW recommends self-evaluation during the postpartum time period using the New Mom Checklist from Postpartum Progress and encouraged MOB to contact a medical professional if symptoms are noted at any time.  MOB and FOB denied any questions.   CSW provided review of Sudden Infant Death Syndrome (SIDS) precautions.  MOB confirmed having all needed items for baby including car seat and bassinet for baby's safe sleep.   CSW identifies no further need for intervention and no barriers to discharge at this time.  Benita D. Dowdell, MSW, LCSWA Clinical Social Worker [phone] 

## 2020-02-16 NOTE — Progress Notes (Signed)
Post Partum Day 1 Subjective: no complaints, up ad lib, voiding, tolerating PO and + flatus  Objective: Blood pressure (!) 98/55, pulse 76, temperature 98 F (36.7 C), temperature source Oral, resp. rate 15, height 5\' 6"  (1.676 m), weight 69.4 kg, last menstrual period 04/24/2019, SpO2 100 %, unknown if currently breastfeeding.  Physical Exam:  General: alert, cooperative and no distress Lochia: appropriate Uterine Fundus: firm Incision: n/a DVT Evaluation: No evidence of DVT seen on physical exam.  Recent Labs    02/14/20 1143  HGB 9.1*  HCT 29.4*    Assessment/Plan: Plan for discharge tomorrow, Breastfeeding and Contraception depo   LOS: 2 days   [place] 02/16/2020, 10:23 AM

## 2020-02-17 MED ORDER — MEDROXYPROGESTERONE ACETATE 150 MG/ML IM SUSP
150.0000 mg | Freq: Once | INTRAMUSCULAR | Status: AC
  Administered 2020-02-17: 150 mg via INTRAMUSCULAR
  Filled 2020-02-17: qty 1

## 2020-02-17 MED ORDER — IBUPROFEN 600 MG PO TABS: 600.0000 mg | ORAL_TABLET | Freq: Four times a day (QID) | ORAL | 0 refills | Status: DC

## 2020-03-25 ENCOUNTER — Telehealth (INDEPENDENT_AMBULATORY_CARE_PROVIDER_SITE_OTHER): Payer: Medicaid Other | Admitting: Obstetrics

## 2020-03-25 ENCOUNTER — Encounter: Payer: Self-pay | Admitting: Obstetrics

## 2020-03-25 DIAGNOSIS — Z3480 Encounter for supervision of other normal pregnancy, unspecified trimester: Secondary | ICD-10-CM

## 2020-03-25 NOTE — Progress Notes (Signed)
   Patient reschedule visit.  Shelly Bombard, MD 03/25/2020 10:50 AM

## 2020-03-27 ENCOUNTER — Telehealth: Payer: No Typology Code available for payment source | Admitting: Obstetrics

## 2020-05-07 ENCOUNTER — Telehealth (INDEPENDENT_AMBULATORY_CARE_PROVIDER_SITE_OTHER): Payer: Medicaid Other | Admitting: Obstetrics

## 2020-05-07 ENCOUNTER — Encounter: Payer: Self-pay | Admitting: Obstetrics

## 2020-05-07 DIAGNOSIS — Z30013 Encounter for initial prescription of injectable contraceptive: Secondary | ICD-10-CM

## 2020-05-07 DIAGNOSIS — Z3009 Encounter for other general counseling and advice on contraception: Secondary | ICD-10-CM | POA: Diagnosis not present

## 2020-05-07 MED ORDER — MEDROXYPROGESTERONE ACETATE 150 MG/ML IM SUSY: 1.0000 mL | PREFILLED_SYRINGE | INTRAMUSCULAR | 4 refills | Status: DC

## 2020-05-07 NOTE — Progress Notes (Signed)
I connected with@ on 05/07/20 at  9:15 AM EDT by: Mychart video and verified that I am speaking with the correct person using two identifiers.  Patient is located at [employer] and provider is located at [employer] for [employer] at [place] .     The purpose of this virtual visit is to provide medical care while limiting exposure to the novel coronavirus. I discussed the limitations, risks, security and privacy concerns of performing an evaluation and management service by mychart and the availability of in person appointments. I also discussed with the patient that there may be a patient responsible charge related to this service. By engaging in this virtual visit, you consent to the provision of healthcare.  Additionally, you authorize for your insurance to be billed for the services provided during this visit.  The patient expressed understanding and agreed to proceed.  The following staff members participated in the virtual visit:  Baltazar Najjar, MD and Hortencia Pilar, RN  Post Partum Visit Note Subjective:   Erin Torres is a 29 y.o. [phone] female being evaluated for postpartum followup.  She is 11 weeks postpartum following a normal spontaneous vaginal delivery at  41 gestational weeks.  I have fully reviewed the prenatal and intrapartum course; pregnancy complicated by migraines and thrombocytosis.  Postpartum course has been normal. Baby is doing well. Baby is feeding by breast. Bleeding no bleeding. Bowel function is normal. Bladder function is normal. Patient is sexually active. Contraception method is Depo-Provera injections. Postpartum depression screening: negative.   The pregnancy intention screening data noted above was reviewed. Potential methods of contraception were discussed. The patient elected to proceed with Hormonal Injection.   The following portions of the patient's history were reviewed and updated as appropriate: allergies, current medications, past family  history, past medical history, past social history, past surgical history and problem list.  Review of Systems A comprehensive review of systems was negative.   Objective:  There were no vitals filed for this visit. Self-Obtained       Assessment:   1. Postpartum care following vaginal delivery - doing well - may return to work  2. Encounter for other general counseling and advice on contraception - wants Depo Provera  3. Encounter for initial prescription of injectable contraceptive Rx: - medroxyPROGESTERone Acetate 150 MG/ML SUSY; Inject 1 mL (150 mg total) into the muscle every 3 (three) months.  Dispense: 0.9 mL; Refill: 4  Plan:  Essential components of care per ACOG recommendations:  1.  Mood and well being: Patient with negative depression screening today. Reviewed local resources for support.  - Patient does not use tobacco.  - hx of drug use? No    2. Infant care and feeding:  -Patient currently breastmilk feeding? Yes If breastmilk feeding discussed return to work and pumping. If needed, patient was provided letter for work to allow for every 2-3 hr pumping breaks, and to be granted a private location to express breastmilk and refrigerated area to store breastmilk. Reviewed importance of draining breast regularly to support lactation. -Social determinants of health (SDOH) reviewed in EPIC. No concerns  3. Sexuality, contraception and birth spacing - Patient does not want a pregnancy in the next year.  Desired family size is 4 children.  - Reviewed forms of contraception in tiered fashion. Patient desired Depo-Provera today.   - Discussed birth spacing of 18 months  4. Sleep and fatigue -Encouraged family/partner/community support of 4 hrs of uninterrupted sleep to help with mood  and fatigue  5. Physical Recovery  - Discussed patients delivery - Patient had a 1st degree laceration, perineal healing reviewed. Patient expressed understanding - Patient has urinary  incontinence? No - Patient is safe to resume physical and sexual activity  6.  Health Maintenance - Last pap smear done 07/18/2019 and was normal with negative HPV.   7. No Chronic Disease   15 minutes of non-face-to-face time spent with the patient    Baltazar Najjar, [place] for [facility], [place] [facility] 05/07/20

## 2020-07-02 IMAGING — US US OB COMP LESS 14 WK
1 series · 15 of 28 positions shown · non-contrast
Comparison: 11/25/2011

CLINICAL DATA: Scan for dating. By uncertain LMP of 04/24/2019 the
patient is 13 weeks 1 day. EDC by this LMP is 01/29/2020. On exam,
fundal height is 12 centimeters.

EXAM:
OBSTETRIC <14 WK ULTRASOUND
TECHNIQUE: Transabdominal ultrasound was performed for evaluation of the
gestation as well as the maternal uterus and adnexal regions.

[Series 1: us ob comp less 14 wk · 54 acquisitions, 15 frames shown]
[im 1/54]
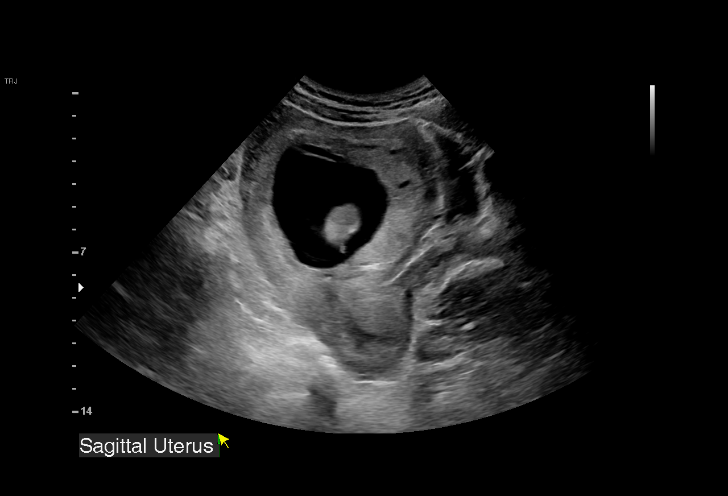
[im 4/54]
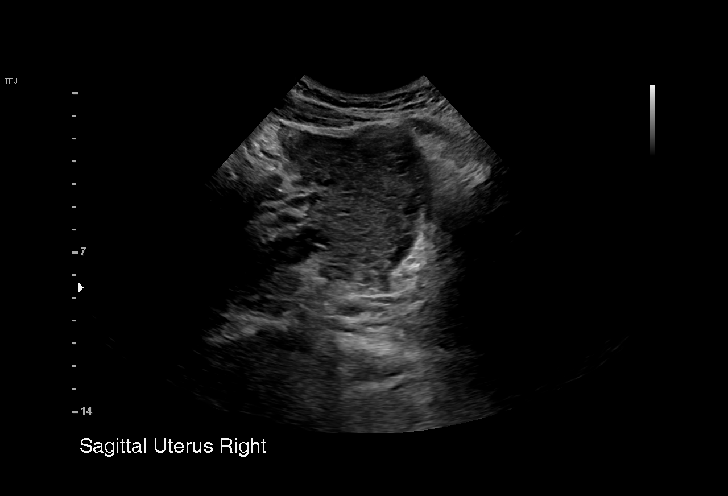
[im 8/54]
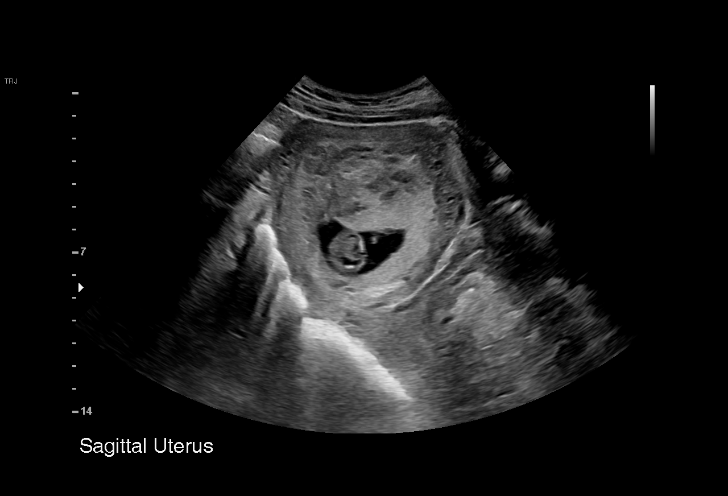
[im 12/54]
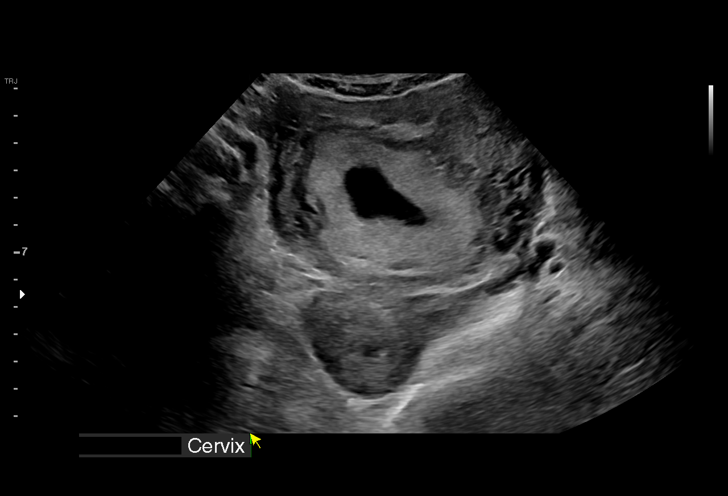
[im 16/54]
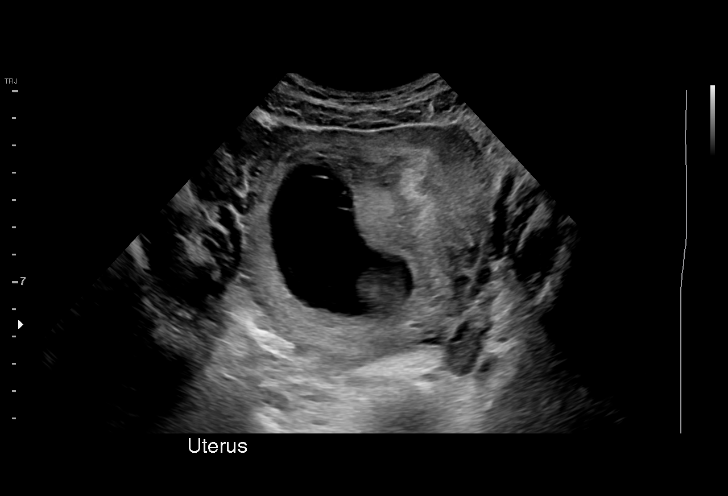
[im 20/54]
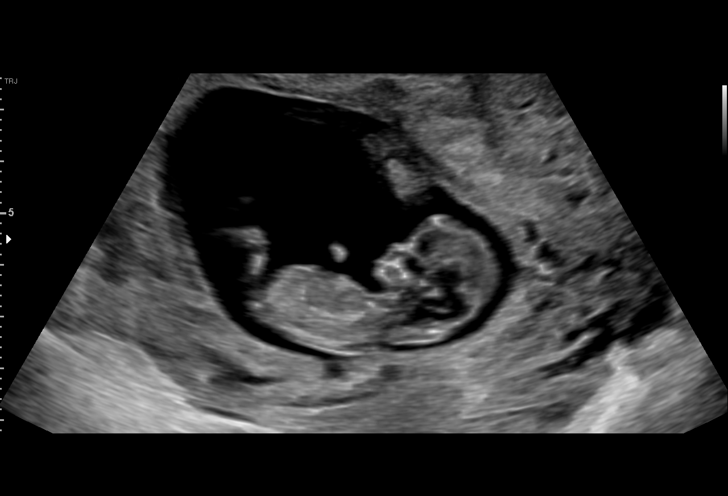
[im 24/54]
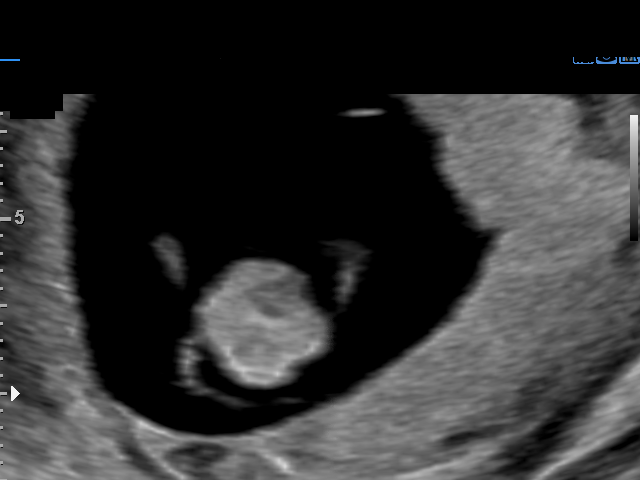
[im 28/54]
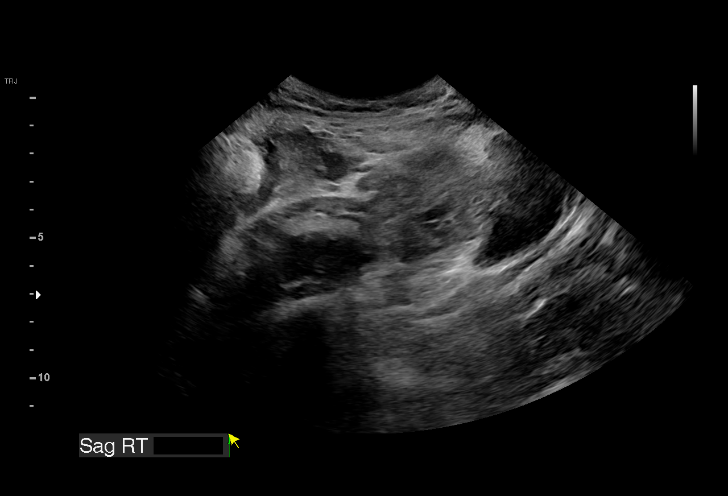
[im 30/54]
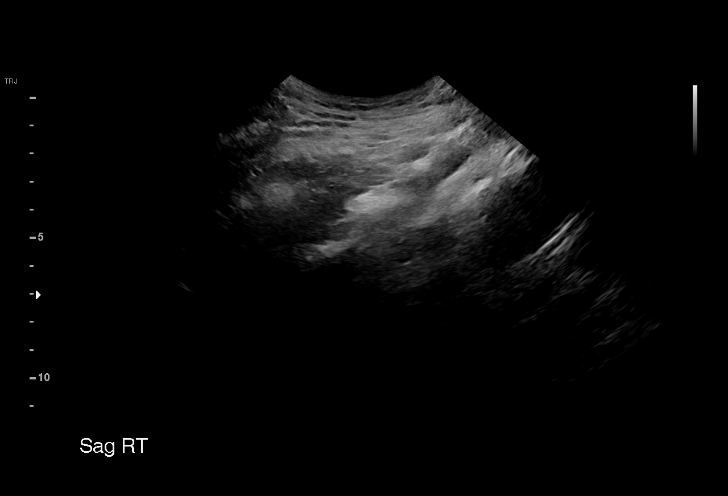
[im 34/54]
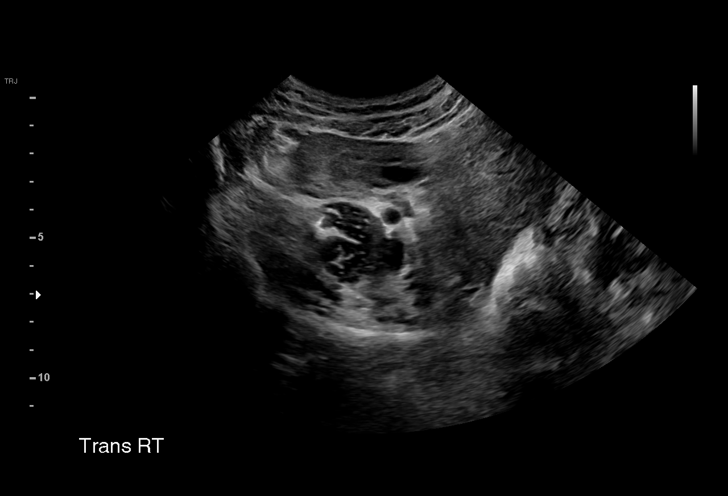
[im 38/54]
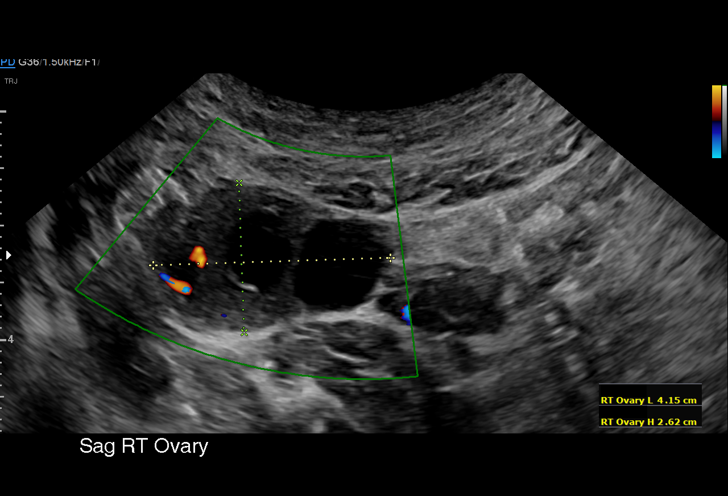
[im 42/54]
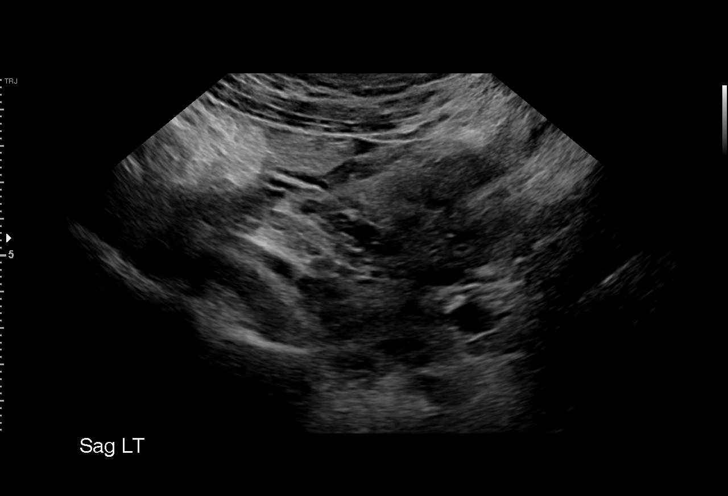
[im 46/54]
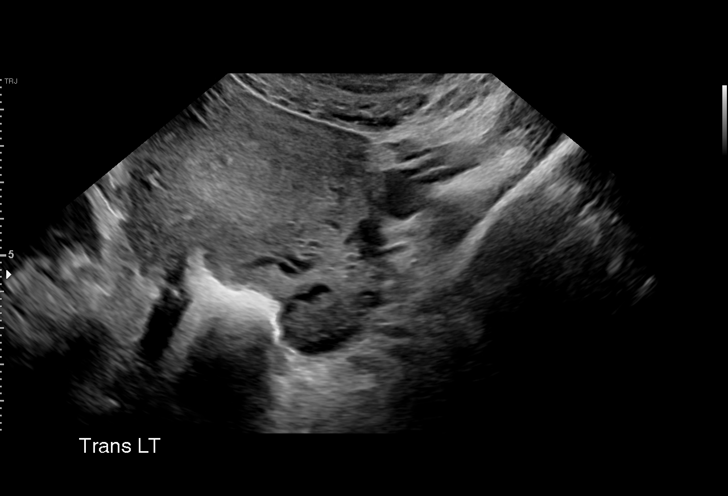
[im 50/54]
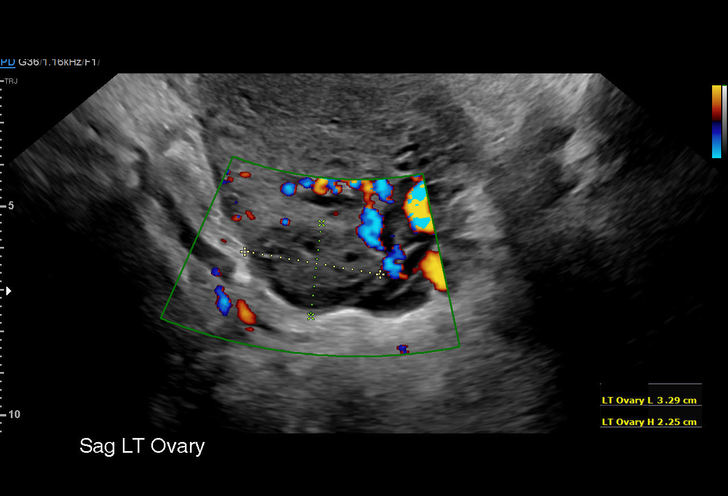
[im 54/54]
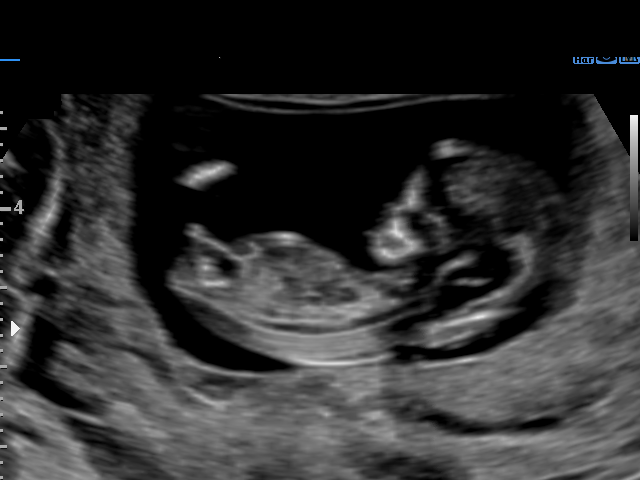

[15 of 28 positions shown; findings below may reference images not displayed]

FINDINGS: Intrauterine gestational sac: Single

Yolk sac:  Not Visualized.

Embryo:  Visualized.

Cardiac Activity: Visualized.

Heart Rate: 144 bpm

CRL:   51.6 mm   11 w 6 d                  US EDC: 02/07/2020

Subchorionic hemorrhage:  None visualized.

Maternal uterus/adnexae: RIGHT corpus luteum cyst. No free pelvic
fluid.
IMPRESSION: 1. Single living intrauterine embryo measuring 11 weeks 6 days.
2. No subchorionic hemorrhage or adnexal mass.
3. By today's exam EDC is 02/07/2020.

## 2020-08-17 IMAGING — US US MFM OB COMP +14 WKS
1 of 2 series · 13 of 28 positions shown · non-contrast
Comparison: none

[Series 1: us mfm ob comp +14 wks · 135 acquisitions, 13 frames shown]
[im 6/135]
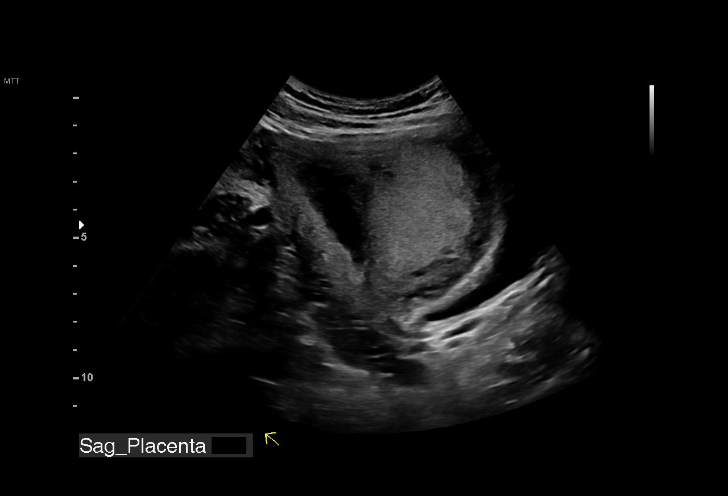
[im 16/135]
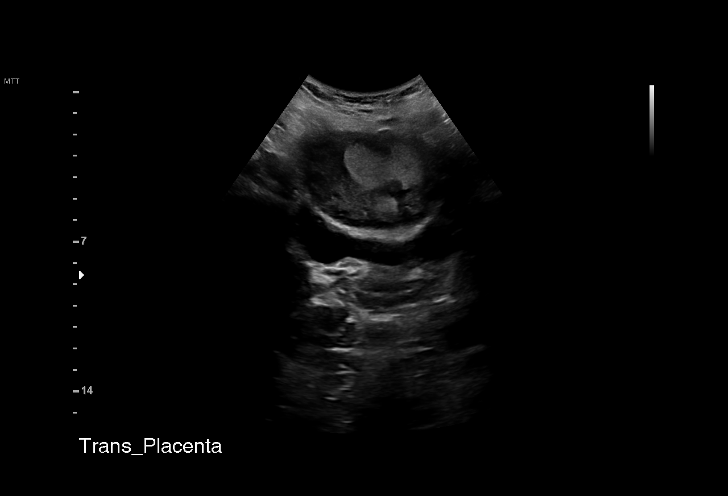
[im 26/135]
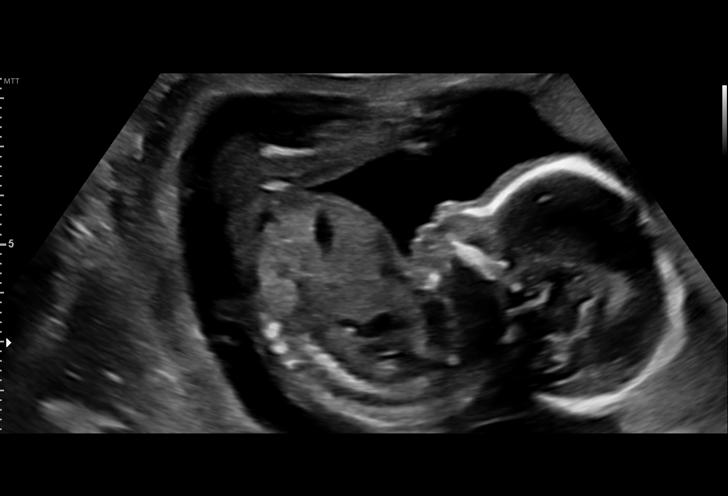
[im 37/135]
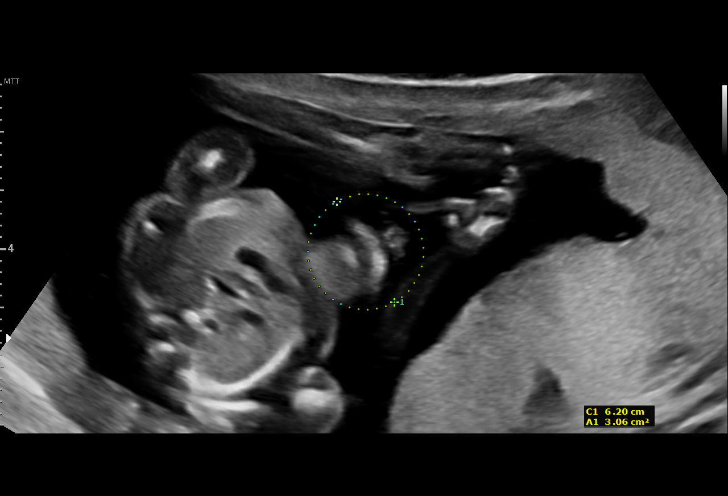
[im 47/135]
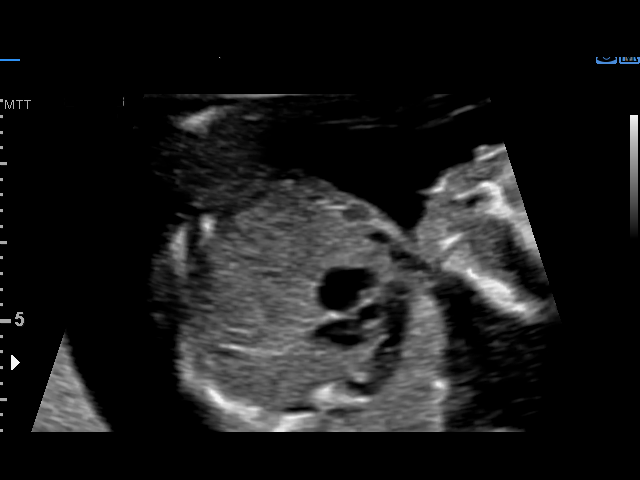
[im 57/135]
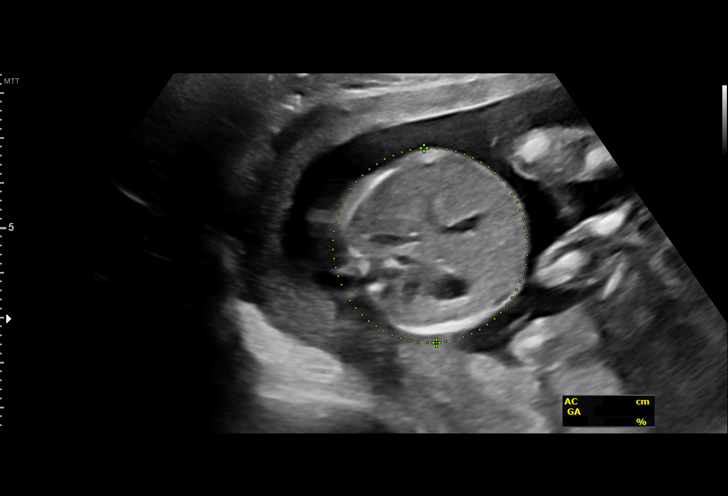
[im 73/135]
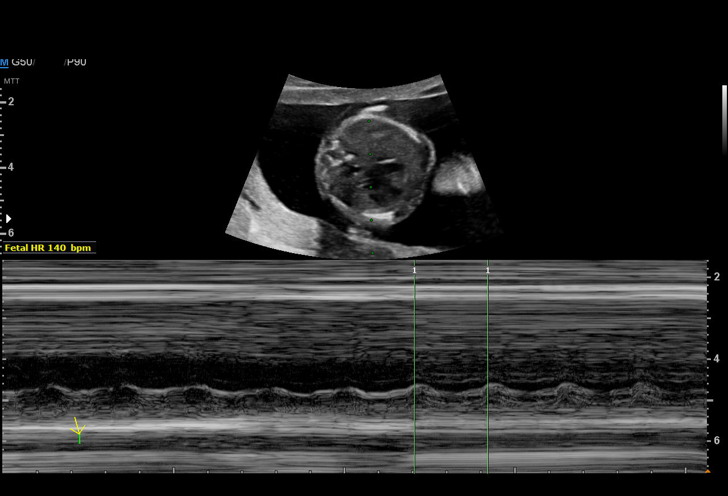
[im 83/135]
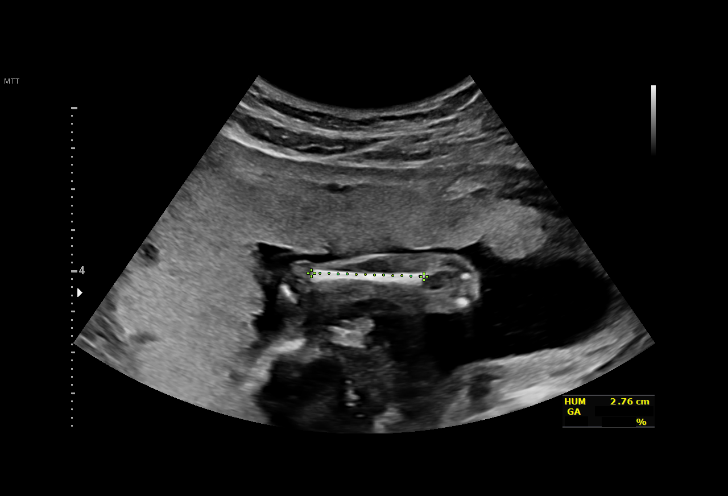
[im 93/135]
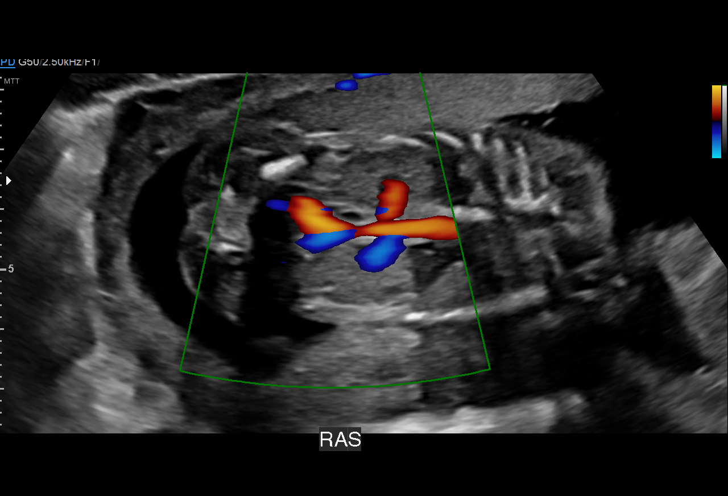
[im 104/135]
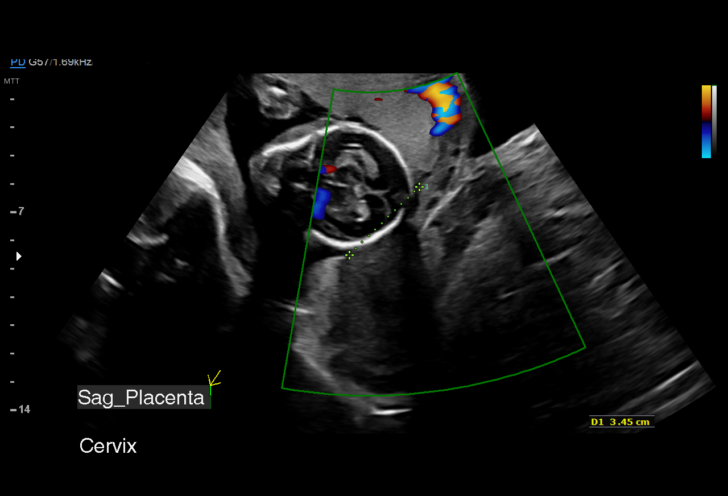
[im 114/135]
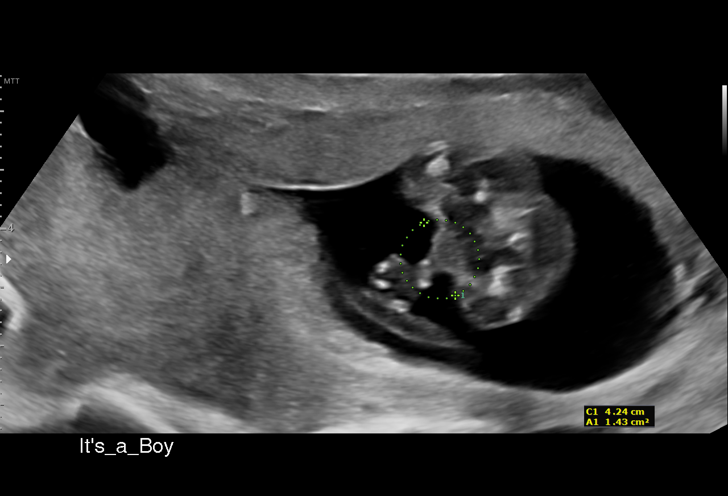
[im 124/135]
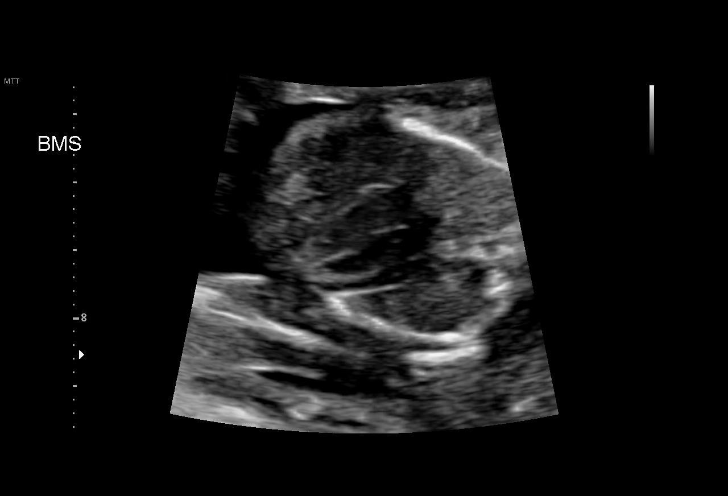
[im 135/135]
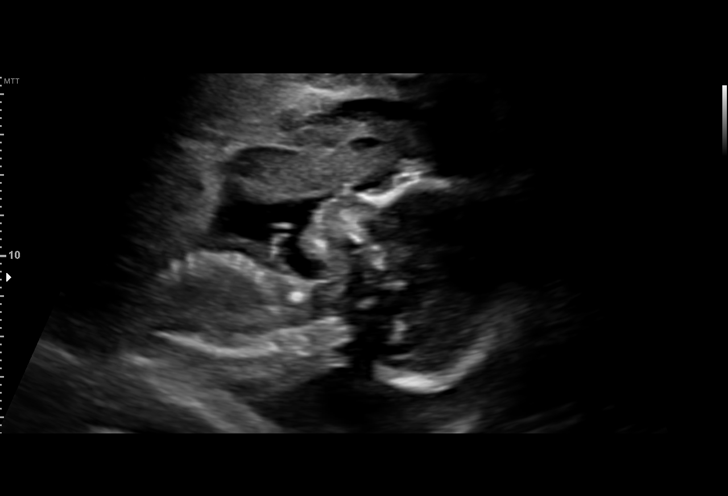

[13 of 28 positions shown; findings below may reference images not displayed]

CNM

  1  US MFM OB COMP + 14 WK               76805.01     ALFA TOLAND
 ----------------------------------------------------------------------

 ----------------------------------------------------------------------
Indications

  18 weeks gestation of pregnancy
  Encounter for antenatal screening for
  malformations (low risk NIPS)
 ----------------------------------------------------------------------
Fetal Evaluation

 Num Of Fetuses:         1
 Fetal Heart Rate(bpm):  140
 Cardiac Activity:       Observed
 Presentation:           Variable
 Placenta:               Anterior
 P. Cord Insertion:      Visualized, central

 Amniotic Fluid
 AFI FV:      Within normal limits

                             Largest Pocket(cm)

Biometry

 BPD:      40.2  mm     G. Age:  18w 1d         40  %    CI:        75.33   %    70 - 86
                                                         FL/HC:      18.4   %    15.8 - 18
 HC:      146.9  mm     G. Age:  17w 6d         16  %    HC/AC:      1.08        1.07 -
 AC:      136.4  mm     G. Age:  19w 1d         69  %    FL/BPD:     67.4   %
 FL:       27.1  mm     G. Age:  18w 2d         38  %    FL/AC:      19.9   %    20 - 24
 HUM:      27.8  mm     G. Age:  18w 6d         67  %
 CER:      18.8  mm     G. Age:  18w 3d         49  %
 NFT:       4.1  mm
 LV:        5.9  mm
 CM:        2.8  mm

 Est. FW:     248  gm      0 lb 9 oz     56  %
OB History

 Gravidity:    5         Term:   3        Prem:   0        SAB:   1
 TOP:          0       Ectopic:  0        Living: 3
Gestational Age

 LMP:           19w 5d        Date:  04/24/19                 EDD:   01/29/20
 U/S Today:     18w 3d                                        EDD:   02/07/20
 Best:          18w 3d     Det. By:  Early Ultrasound         EDD:   02/07/20
                                     (07/25/19)
Anatomy

 Cranium:               Appears normal         LVOT:                   Appears normal
 Cavum:                 Appears normal         Aortic Arch:            Appears normal
 Ventricles:            Appears normal         Ductal Arch:            Appears normal
 Choroid Plexus:        Appears normal         Diaphragm:              Appears normal
 Cerebellum:            Appears normal         Stomach:                Appears normal, left
                                                                       sided
 Posterior Fossa:       Appears normal         Abdomen:                Appears normal
 Nuchal Fold:           Appears normal         Abdominal Wall:         Appears nml (cord
                                                                       insert, abd wall)
 Face:                  Appears normal         Cord Vessels:           Appears normal (3
                        (orbits and profile)                           vessel cord)
 Lips:                  Appears normal         Kidneys:                Appear normal
 Palate:                Not well visualized    Bladder:                Appears normal
 Thoracic:              Appears normal         Spine:                  Appears normal
 Heart:                 Appears normal         Upper Extremities:      Appears normal
                        (4CH, axis, and
                        situs)
 RVOT:                  Appears normal         Lower Extremities:      Appears normal

 Other:  Male gender. Heels and 5th digit visualized. Nasal bone visualized.
         Technically difficult due to fetal position.
Cervix Uterus Adnexa

 Cervix
 Length:           3.12  cm.
 Normal appearance by transabdominal scan.

 Uterus
 No abnormality visualized.

 Left Ovary
 Within normal limits. No adnexal mass visualized.

 Right Ovary
 Within normal limits. No adnexal mass visualized.

 Cul De Sac
 No free fluid seen.

 Adnexa
 No abnormality visualized.
Impression

 We performed fetal anatomy scan. No makers of
 aneuploidies or fetal structural defects are seen. Fetal
 biometry is consistent with her previously-established dates.
 Amniotic fluid is normal and good fetal activity is seen.
 Patient understands the limitations of ultrasound in detecting
 fetal anomalies.
 MSAFP screening showed low risk for open-neural tube
 defects.
 On cell-free fetal DNA screening, the risks of fetal
 aneuploidies are not increased.

 Obstetric history is significant for 3 previous term vaginal
 deliveries.
Recommendations

 -Follow-up scans as clinically indicated.
                 [place], [place]

## 2021-08-31 ENCOUNTER — Other Ambulatory Visit: Payer: Self-pay

## 2021-08-31 ENCOUNTER — Ambulatory Visit (INDEPENDENT_AMBULATORY_CARE_PROVIDER_SITE_OTHER): Payer: Medicaid Other | Admitting: *Deleted

## 2021-08-31 VITALS — BP 103/66 | HR 71

## 2021-08-31 DIAGNOSIS — N912 Amenorrhea, unspecified: Secondary | ICD-10-CM

## 2021-08-31 DIAGNOSIS — Z3480 Encounter for supervision of other normal pregnancy, unspecified trimester: Secondary | ICD-10-CM

## 2021-08-31 DIAGNOSIS — Z3201 Encounter for pregnancy test, result positive: Secondary | ICD-10-CM | POA: Diagnosis not present

## 2021-08-31 LAB — POCT URINE PREGNANCY: Preg Test, Ur: POSITIVE — AB

## 2021-08-31 MED ORDER — PRENATE MINI 18-0.6-0.4-350 MG PO CAPS
1.0000 | ORAL_CAPSULE | Freq: Every day | ORAL | 11 refills | Status: AC
Start: 2021-08-31 — End: ?

## 2021-08-31 NOTE — Progress Notes (Signed)
Erin Torres presents today for UPT. She has no unusual complaints. LMP:  Unsure/ possibly Sept or Oct    OBJECTIVE: Appears well, in no apparent distress.  OB History     Gravida  5   Para  4   Term  4   Preterm      AB  1   Living  4      SAB  1   IAB      Ectopic      Multiple  0   Live Births  4          Home UPT Result: positive In-Office UPT result:positive I have reviewed the patient's medical, obstetrical, social, and family histories, and medications.   ASSESSMENT: Positive pregnancy test  PLAN Prenatal care to be completed at: [place] sent for PNV Appointment scheduled for [place] for dating due to unknown LMP.

## 2021-09-20 ENCOUNTER — Other Ambulatory Visit: Payer: Self-pay

## 2021-09-20 ENCOUNTER — Ambulatory Visit (INDEPENDENT_AMBULATORY_CARE_PROVIDER_SITE_OTHER): Payer: Medicaid Other

## 2021-09-20 VITALS — BP 106/68 | HR 82 | Ht 64.0 in | Wt 135.2 lb

## 2021-09-20 DIAGNOSIS — Z3A13 13 weeks gestation of pregnancy: Secondary | ICD-10-CM

## 2021-09-20 DIAGNOSIS — O3680X0 Pregnancy with inconclusive fetal viability, not applicable or unspecified: Secondary | ICD-10-CM

## 2021-09-20 DIAGNOSIS — Z3490 Encounter for supervision of normal pregnancy, unspecified, unspecified trimester: Secondary | ICD-10-CM | POA: Insufficient documentation

## 2021-09-20 DIAGNOSIS — Z3481 Encounter for supervision of other normal pregnancy, first trimester: Secondary | ICD-10-CM | POA: Diagnosis not present

## 2021-09-20 MED ORDER — BLOOD PRESSURE KIT DEVI: 1.0000 | 0 refills | Status: AC

## 2021-09-20 NOTE — Progress Notes (Signed)
New OB Intake  I connected with  Erin Torres on 09/20/21 at  1:15 PM EST by in person Video and verified that I am speaking with the correct person using two identifiers. Nurse is located at [facility] and pt is located at [place].  I discussed the limitations, risks, security and privacy concerns of performing an evaluation and management service by telephone and the availability of in person appointments. I also discussed with the patient that there may be a patient responsible charge related to this service. The patient expressed understanding and agreed to proceed.  I explained I am completing New OB Intake today. We discussed her EDD of 03/24/22 that is based on early u/s. Pt is [id number]. I reviewed her allergies, medications, Medical/Surgical/OB history, and appropriate screenings. I informed her of [facility] services. Based on history, this is a/an  pregnancy uncomplicated .   Patient Active Problem List   Diagnosis Date Noted   Post term pregnancy at [redacted] weeks gestation 02/14/2020   Thrombocytosis 07/19/2019   Supervision of other normal pregnancy, antepartum 07/18/2019   NSVD (normal spontaneous vaginal delivery) 06/16/2015   Hx of migraines 12/17/2014    Concerns addressed today  Delivery Plans:  [facility].   MyChart/Babyscripts MyChart access verified. I explained pt will have some visits in office and some virtually. Babyscripts instructions given and order placed. Patient verifies receipt of registration text/e-mail. Account successfully created and app downloaded.  Blood Pressure Cuff  Blood pressure cuff ordered for patient to pick-up from [employer]. Explained after first prenatal appt pt will check weekly and document in 11.  Weight scale: Patient    have weight scale. Weight scale ordered for patient to pick up form [facility].   Anatomy US Explained first scheduled [place] will be around 19 weeks. Dating and viability scan performed  today.  Labs Discussed Johnsie Cancel genetic screening with patient. Would like both Panorama and Horizon drawn at new OB visit. Routine prenatal labs needed.  Covid Vaccine Patient has not covid vaccine.    Informed patient of [facility] website  and placed link in her AVS.   Social Determinants of Health Food Insecurity: Patient denies food insecurity. WIC Referral: Patient is not interested in referral to [facility].  Transportation: Patient denies transportation needs. Childcare: Discussed no children allowed at ultrasound appointments. Offered childcare services; patient declines childcare services at this time.  Send link to Pregnancy Navigators   Placed OB Box on problem list and updated  First visit review I reviewed new OB appt with pt. I explained she will have a pelvic exam, ob bloodwork with genetic screening, and PAP smear. Explained pt will be seen by Emeterio Reeve at first visit; encounter routed to appropriate provider. Explained that patient will be seen by pregnancy navigator following visit with provider. [facility] information placed in AVS.   Lucianne Lei, RN 09/20/2021  1:13 PM

## 2021-09-20 NOTE — Progress Notes (Signed)
Patient was assessed and managed by nursing staff during this encounter. I have reviewed the chart and agree with the documentation and plan. I have also made any necessary editorial changes.  Mora Bellman, MD 09/20/2021 4:41 PM

## 2021-10-08 ENCOUNTER — Ambulatory Visit (INDEPENDENT_AMBULATORY_CARE_PROVIDER_SITE_OTHER): Payer: Medicaid Other | Admitting: Obstetrics & Gynecology

## 2021-10-08 ENCOUNTER — Other Ambulatory Visit: Payer: Self-pay

## 2021-10-08 ENCOUNTER — Other Ambulatory Visit (HOSPITAL_COMMUNITY)
Admission: RE | Admit: 2021-10-08 | Discharge: 2021-10-08 | Disposition: A | Discharge status: Home / Self Care | Payer: Medicaid Other | Source: Ambulatory Visit | Attending: Obstetrics & Gynecology | Admitting: Obstetrics & Gynecology

## 2021-10-08 ENCOUNTER — Ambulatory Visit (INDEPENDENT_AMBULATORY_CARE_PROVIDER_SITE_OTHER): Payer: Medicaid Other | Admitting: Licensed Clinical Social Worker

## 2021-10-08 VITALS — BP 100/66 | HR 82 | Wt 134.8 lb

## 2021-10-08 DIAGNOSIS — Z3482 Encounter for supervision of other normal pregnancy, second trimester: Secondary | ICD-10-CM | POA: Diagnosis not present

## 2021-10-08 DIAGNOSIS — Z3A16 16 weeks gestation of pregnancy: Secondary | ICD-10-CM

## 2021-10-08 DIAGNOSIS — F439 Reaction to severe stress, unspecified: Secondary | ICD-10-CM

## 2021-10-08 NOTE — Progress Notes (Signed)
New OB OB Panel, OB Urine, GC/CC, Pap today Genetic screening and AFP offered and accepted today.

## 2021-10-08 NOTE — Progress Notes (Signed)
°  Subjective: feels well    Erin Torres is a [id number] [redacted]w[redacted]d being seen today for her first obstetrical visit.  Her obstetrical history is significant for  multiparous . Patient does intend to breast feed. Pregnancy history fully reviewed.  Patient reports no complaints.  Vitals:   10/08/21 1104  BP: 100/66  Pulse: 82  Weight: 134 lb 12.8 oz (61.1 kg)    HISTORY: OB History  Gravida Para Term Preterm AB Living  6 4 4   1 4   SAB IAB Ectopic Multiple Live Births  1     0 4    # Outcome Date GA Lbr Len/2nd Weight Sex Delivery Anes PTL Lv  6 Current           5 Term 02/15/20 [redacted]w[redacted]d 00:41 / 00:01 7 lb 15.9 oz (3.625 kg) M Vag-Spont None  LIV  4 Term 04/01/17 [redacted]w[redacted]d / 00:09 6 lb 15.5 oz (3.16 kg) M Vag-Spont None  LIV  3 Term 06/16/15 [redacted]w[redacted]d 01:51 / 00:11 7 lb 11.4 oz (3.498 kg) F Vag-Spont None  LIV  2 SAB 2014 [redacted]w[redacted]d            Birth Comments: System Generated. Please review and update pregnancy details.  1 Term 07/11/10 [redacted]w[redacted]d  7 lb (3.175 kg) M Vag-Spont None  LIV   Past Medical History:  Diagnosis Date   Anemia    Medical history non-contributory    No pertinent past medical history    Past Surgical History:  Procedure Laterality Date   NO PAST SURGERIES     Family History  Problem Relation Age of Onset   Hypertension Maternal Grandmother    Hypertension Paternal Grandmother    Diabetes Paternal Grandmother    Anesthesia problems Neg Hx      Exam    Uterus:   16 weeks  Pelvic Exam:    Perineum: No Hemorrhoids   Vulva: normal   Vagina:  normal mucosa   pH:    Cervix: no lesions   Adnexa: normal adnexa   Bony Pelvis: average  System: Breast:  normal appearance, no masses or tenderness   Skin: normal coloration and turgor, no rashes    Neurologic: oriented, normal mood   Extremities: normal strength, tone, and muscle mass   HEENT PERRLA and neck supple with midline trachea   Mouth/Teeth mucous membranes moist, pharynx normal without lesions   Neck  supple   Cardiovascular: regular rate and rhythm, no murmurs or gallops   Respiratory:  appears well, vitals normal, no respiratory distress, acyanotic, normal RR, neck free of mass or lymphadenopathy, chest clear, no wheezing, crepitations, rhonchi, normal symmetric air entry   Abdomen: soft, non-tender; bowel sounds normal; no masses,  no organomegaly   Urinary: urethral meatus normal      Assessment:    Pregnancy: [id number] Patient Active Problem List   Diagnosis Date Noted   Supervision of normal pregnancy 09/20/2021   Thrombocytosis 07/19/2019   Hx of migraines 12/17/2014        Plan:     Initial labs drawn. Prenatal vitamins. Problem list reviewed and updated. Genetic Screening discussed : ordered.  Ultrasound discussed; fetal survey: ordered.  Follow up in 4 weeks. 50% of 30 min visit spent on counseling and coordination of care.  Pap normal 07/2019   Emeterio Reeve 10/08/2021

## 2021-10-10 LAB — OBSTETRIC PANEL, INCLUDING HIV
Antibody Screen: NEGATIVE
Basophils Absolute: 0.1 10*3/uL (ref 0.0–0.2)
Basos: 1 %
EOS (ABSOLUTE): 0.2 10*3/uL (ref 0.0–0.4)
Eos: 2 %
HIV Screen 4th Generation wRfx: NONREACTIVE
Hematocrit: 34.4 % (ref 34.0–46.6)
Hemoglobin: 12 g/dL (ref 11.1–15.9)
Hepatitis B Surface Ag: NEGATIVE
Immature Grans (Abs): 0 10*3/uL (ref 0.0–0.1)
Immature Granulocytes: 0 %
Lymphocytes Absolute: 2.2 10*3/uL (ref 0.7–3.1)
Lymphs: 31 %
MCH: 30.5 pg (ref 26.6–33.0)
MCHC: 34.9 g/dL (ref 31.5–35.7)
MCV: 88 fL (ref 79–97)
Monocytes Absolute: 0.6 10*3/uL (ref 0.1–0.9)
Monocytes: 8 %
Neutrophils Absolute: 4.1 10*3/uL (ref 1.4–7.0)
Neutrophils: 58 %
Platelets: 429 10*3/uL (ref 150–450)
RBC: 3.93 x10E6/uL (ref 3.77–5.28)
RDW: 12.8 % (ref 11.7–15.4)
RPR Ser Ql: NONREACTIVE
Rh Factor: POSITIVE
Rubella Antibodies, IGG: 1.68 index (ref >0.99)
WBC: 7.2 10*3/uL (ref 3.4–10.8)

## 2021-10-10 LAB — AFP, SERUM, OPEN SPINA BIFIDA
AFP MoM: 1.18
AFP Value: 48.9 ng/mL
Gest. Age on Collection Date: 16 weeks
Maternal Age At EDD: 32.6 yr
OSBR Risk 1 IN: 10000
Test Results:: NEGATIVE
Weight: 134 [lb_av]

## 2021-10-10 LAB — HEMOGLOBIN A1C
Est. average glucose Bld gHb Est-mCnc: 103 mg/dL
Hgb A1c MFr Bld: 5.2 % (ref 4.8–5.6)

## 2021-10-10 LAB — HEPATITIS C ANTIBODY: Hep C Virus Ab: <0.1 s/co ratio (ref 0.0–0.9)

## 2021-10-10 NOTE — L&D Delivery Note (Cosign Needed)
OB/GYN Faculty Practice Delivery Note  Erin Torres is a 31 y.o. [id number] s/p SVD at [redacted]w[redacted]d She was admitted for spontaneous labor.   ROM: 0h 242mith clear fluid GBS Status: negative Maximum Maternal Temperature: 98.5  Labor Progress: Presented with contractions, found to be 5.5 cm and was expectantly managed. She progressed to 7 cm and was AROMed and very soon progressed to complete.   Delivery Date/Time: 2201 on 6/9 Delivery: Called to room and patient was complete and pushing. Head delivered direct OP. No nuchal cord present. Shoulder and body delivered in usual fashion. Infant with spontaneous cry, placed on mother's abdomen, dried and stimulated. Cord clamped x 2 after 1-minute delay, and cut by father of baby under my direct supervision. Cord blood drawn. Placenta delivered spontaneously with gentle cord traction. Fundus firm with massage and Pitocin. She had some trickling and was given TXA. She was given her multiparous status a lower uterine segment sweep was done and a moderate size clot was removed. Of note, patient was given Fentanyl prior to sweep given she was unmedicated. Labia, perineum, vagina, and cervix inspected and found to have a hemostatic abrasion and no lacerations and no repair was done.  Placenta: intact, 3V cord, to L&D Complications: none Lacerations: none EBL: 200cc Analgesia: IV fentanyl  Infant: female  APGARs 8,9  weight pending  AnRenard MatterMD, MPH OB Fellow, [place]

## 2021-10-11 LAB — URINE CULTURE, OB REFLEX

## 2021-10-11 LAB — CULTURE, OB URINE

## 2021-10-12 LAB — CERVICOVAGINAL ANCILLARY ONLY
Chlamydia: NEGATIVE
Comment: NEGATIVE
Comment: NORMAL
Neisseria Gonorrhea: NEGATIVE

## 2021-10-13 NOTE — BH Specialist Note (Signed)
Integrated Behavioral Health Initial In-Person Visit  MRN: [id number] Name: Erin Torres  Number of [place] Clinician visits:: 1/6 Session Start time: 1130am  Session End time: 12:01pm Total time: 30 minutes in person at [facility]   Types of Service: Individual psychotherapy  Interpretor:No. Interpretor Name and Language: none   Warm Hand Off Completed.        Subjective: Erin Torres is a 31 y.o. female accompanied by n/a Patient was referred by A Patent examiner for [employer]. Patient reports the following symptoms/concerns: stress Duration of problem: over one year ; Severity of problem: mild  Objective: Mood: good  and Affect: Appropriate Risk of harm to self or others: No plan to harm self or others  Life Context: Family and Social: Lives with spouse and children  School/Work: n/a Self-Care: None  Life Changes: New pregnancy  Patient and/or Family's Strengths/Protective Factors: Concrete supports in place (healthy food, safe environments, etc.)  Goals Addressed: Patient will: Reduce symptoms of: stress Increase knowledge and/or ability of: coping skills and stress reduction  Demonstrate ability to: Increase healthy adjustment to current life circumstances  Progress towards Goals: Ongoing  Interventions: Interventions utilized: Supportive Counseling  Standardized Assessments completed: PHQ 9  Patient and/or Family Response: Erin Torres reports stress and overwhelmed with parenting and household task.   Assessment: Patient currently experiencing situational stress .   Patient may benefit from integrated behavioral health.  Plan: Follow up with behavioral health clinician on : 11/05/2021 Behavioral recommendations: Prioritize rest, engage in stress reducing activity, create personal goals for skill building and keep medical appts. Referral(s): [place] (In Clinic) "From scale of 1-10, how likely are you  to follow plan?":    Erin Ferrier, LCSW

## 2021-10-18 ENCOUNTER — Encounter: Payer: Self-pay | Admitting: Obstetrics & Gynecology

## 2021-10-28 ENCOUNTER — Ambulatory Visit: Payer: Medicaid Other | Attending: Obstetrics & Gynecology

## 2021-11-05 ENCOUNTER — Encounter: Payer: Medicaid Other | Admitting: Obstetrics

## 2021-11-05 ENCOUNTER — Ambulatory Visit: Payer: Medicaid Other | Admitting: Licensed Clinical Social Worker

## 2021-11-16 ENCOUNTER — Ambulatory Visit: Payer: Medicaid Other | Attending: Obstetrics & Gynecology

## 2021-11-23 ENCOUNTER — Other Ambulatory Visit: Payer: Self-pay | Admitting: *Deleted

## 2021-11-23 DIAGNOSIS — Z3480 Encounter for supervision of other normal pregnancy, unspecified trimester: Secondary | ICD-10-CM

## 2021-12-10 ENCOUNTER — Ambulatory Visit: Payer: Medicaid Other

## 2021-12-13 ENCOUNTER — Encounter: Payer: Medicaid Other | Admitting: Obstetrics and Gynecology

## 2021-12-14 ENCOUNTER — Other Ambulatory Visit: Payer: Self-pay

## 2021-12-14 ENCOUNTER — Ambulatory Visit: Payer: Medicaid Other | Attending: Obstetrics & Gynecology

## 2021-12-14 DIAGNOSIS — Z3480 Encounter for supervision of other normal pregnancy, unspecified trimester: Secondary | ICD-10-CM | POA: Diagnosis not present

## 2021-12-14 DIAGNOSIS — Z363 Encounter for antenatal screening for malformations: Secondary | ICD-10-CM | POA: Insufficient documentation

## 2021-12-14 DIAGNOSIS — Z3A25 25 weeks gestation of pregnancy: Secondary | ICD-10-CM | POA: Insufficient documentation

## 2021-12-15 ENCOUNTER — Other Ambulatory Visit: Payer: Self-pay | Admitting: *Deleted

## 2021-12-15 DIAGNOSIS — Z362 Encounter for other antenatal screening follow-up: Secondary | ICD-10-CM

## 2021-12-29 ENCOUNTER — Encounter: Payer: Self-pay | Admitting: Obstetrics

## 2021-12-29 ENCOUNTER — Other Ambulatory Visit: Payer: Self-pay

## 2021-12-29 ENCOUNTER — Ambulatory Visit (INDEPENDENT_AMBULATORY_CARE_PROVIDER_SITE_OTHER): Payer: Medicaid Other | Admitting: Obstetrics

## 2021-12-29 VITALS — BP 106/72 | HR 87 | Wt 145.0 lb

## 2021-12-29 DIAGNOSIS — Z3480 Encounter for supervision of other normal pregnancy, unspecified trimester: Secondary | ICD-10-CM

## 2021-12-29 DIAGNOSIS — Z3482 Encounter for supervision of other normal pregnancy, second trimester: Secondary | ICD-10-CM

## 2021-12-29 DIAGNOSIS — K029 Dental caries, unspecified: Secondary | ICD-10-CM

## 2021-12-29 DIAGNOSIS — Z3A27 27 weeks gestation of pregnancy: Secondary | ICD-10-CM

## 2021-12-29 MED ORDER — CLINDAMYCIN HCL 300 MG PO CAPS: 300.0000 mg | ORAL_CAPSULE | Freq: Three times a day (TID) | ORAL | 0 refills | Status: DC

## 2021-12-29 NOTE — Progress Notes (Signed)
Pt in office for [place] visit . Pt needs note for clearance for dental care. Pt has no other concerns at this time.  ?

## 2021-12-29 NOTE — Progress Notes (Signed)
Subjective:  ?Erin Torres is a 31 y.o. [id number] at 62w6dbeing seen today for ongoing prenatal care.  She is currently monitored for the following issues for this low-risk pregnancy and has Hx of migraines; Thrombocytosis; and Supervision of normal pregnancy on their problem list. ? ?Patient reports  toothache .  Contractions: Irritability. Vag. Bleeding: None.  Movement: Present. Denies leaking of fluid.  ? ?The following portions of the patient's history were reviewed and updated as appropriate: allergies, current medications, past family history, past medical history, past social history, past surgical history and problem list. Problem list updated. ? ?Objective:  ? ?Vitals:  ? 12/29/21 1052  ?BP: 106/72  ?Pulse: 87  ?Weight: 145 lb (65.8 kg)  ? ? ?Fetal Status:     Movement: Present    ? ?General:  Alert, oriented and cooperative. Patient is in no acute distress.  ?Skin: Skin is warm and dry. No rash noted.   ?Cardiovascular: Normal heart rate noted  ?Respiratory: Normal respiratory effort, no problems with respiration noted  ?Abdomen: Soft, gravid, appropriate for gestational age. Pain/Pressure: Present     ?Pelvic:  Cervical exam deferred        ?Extremities: Normal range of motion.  Edema: Trace  ?Mental Status: Normal mood and affect. Normal behavior. Normal judgment and thought content.  ? ?Urinalysis:     ? ?Assessment and Plan:  ?Pregnancy: [id number] 230w6d ?1. Supervision of other normal pregnancy, antepartum ? ?2. Dental caries ?Rx: ?- clindamycin (CLEOCIN) 300 MG capsule; Take 1 capsule (300 mg total) by mouth 3 (three) times daily.  Dispense: 21 capsule; Refill: 0 ?- patient to make dental appointment ? ? ?Preterm labor symptoms and general obstetric precautions including but not limited to vaginal bleeding, contractions, leaking of fluid and fetal movement were reviewed in detail with the patient. ?Please refer to After Visit Summary for other counseling recommendations.  ? ?Return in about  1 week (around 01/05/2022) for ROB, 2 hour OGTT. ? ? ?HaShelly BombardMD  ?12/29/21  ?

## 2022-01-10 ENCOUNTER — Encounter: Payer: Medicaid Other | Admitting: Obstetrics & Gynecology

## 2022-01-10 ENCOUNTER — Other Ambulatory Visit: Payer: Medicaid Other

## 2022-01-11 ENCOUNTER — Ambulatory Visit: Payer: Medicaid Other | Attending: Maternal & Fetal Medicine

## 2022-01-11 ENCOUNTER — Ambulatory Visit: Payer: Medicaid Other

## 2022-01-27 ENCOUNTER — Encounter: Payer: Medicaid Other | Admitting: Obstetrics

## 2022-01-27 ENCOUNTER — Other Ambulatory Visit: Payer: Medicaid Other

## 2022-02-11 ENCOUNTER — Encounter: Payer: Self-pay | Admitting: Obstetrics

## 2022-02-11 ENCOUNTER — Ambulatory Visit (INDEPENDENT_AMBULATORY_CARE_PROVIDER_SITE_OTHER): Payer: Medicaid Other | Admitting: Obstetrics

## 2022-02-11 VITALS — BP 100/68 | HR 77 | Wt 152.2 lb

## 2022-02-11 DIAGNOSIS — Z3480 Encounter for supervision of other normal pregnancy, unspecified trimester: Secondary | ICD-10-CM

## 2022-02-11 NOTE — Progress Notes (Signed)
Subjective:  ?Saylor TIMYA TRIMMER is a 31 y.o. [id number] at 28w1dbeing seen today for ongoing prenatal care.  She is currently monitored for the following issues for this low-risk pregnancy and has Hx of migraines; Thrombocytosis; and Supervision of normal pregnancy on their problem list. ? ?Patient reports no complaints.  Contractions: Irritability. Vag. Bleeding: None.  Movement: Present. Denies leaking of fluid.  ? ?The following portions of the patient's history were reviewed and updated as appropriate: allergies, current medications, past family history, past medical history, past social history, past surgical history and problem list. Problem list updated. ? ?Objective:  ? ?Vitals:  ? 02/11/22 0853  ?BP: 100/68  ?Pulse: 77  ?Weight: 152 lb 3.2 oz (69 kg)  ? ? ?Fetal Status: Fetal Heart Rate (bpm): 124   Movement: Present    ? ?General:  Alert, oriented and cooperative. Patient is in no acute distress.  ?Skin: Skin is warm and dry. No rash noted.   ?Cardiovascular: Normal heart rate noted  ?Respiratory: Normal respiratory effort, no problems with respiration noted  ?Abdomen: Soft, gravid, appropriate for gestational age. Pain/Pressure: Absent     ?Pelvic:  Cervical exam deferred        ?Extremities: Normal range of motion.     ?Mental Status: Normal mood and affect. Normal behavior. Normal judgment and thought content.  ? ?Urinalysis:     ? ?Assessment and Plan:  ?Pregnancy: [id number] 325w1d ?There are no diagnoses linked to this encounter. ?Preterm labor symptoms and general obstetric precautions including but not limited to vaginal bleeding, contractions, leaking of fluid and fetal movement were reviewed in detail with the patient. ?Please refer to After Visit Summary for other counseling recommendations.  ? ?No follow-ups on file. ? ? ?HaShelly BombardMD  ?

## 2022-02-22 ENCOUNTER — Ambulatory Visit (INDEPENDENT_AMBULATORY_CARE_PROVIDER_SITE_OTHER): Payer: Medicaid Other | Admitting: Obstetrics

## 2022-02-22 ENCOUNTER — Other Ambulatory Visit: Payer: Medicaid Other

## 2022-02-22 ENCOUNTER — Encounter: Payer: Self-pay | Admitting: Obstetrics

## 2022-02-22 VITALS — BP 115/70 | HR 99 | Wt 151.6 lb

## 2022-02-22 DIAGNOSIS — Z3483 Encounter for supervision of other normal pregnancy, third trimester: Secondary | ICD-10-CM

## 2022-02-22 DIAGNOSIS — Z3480 Encounter for supervision of other normal pregnancy, unspecified trimester: Secondary | ICD-10-CM

## 2022-02-22 DIAGNOSIS — Z3A35 35 weeks gestation of pregnancy: Secondary | ICD-10-CM

## 2022-02-22 NOTE — Progress Notes (Signed)
Patient presents for ROB and GTT. Patient has no concerns today. Declines TDAP. ?

## 2022-02-22 NOTE — Progress Notes (Signed)
Subjective:  ?Erin Torres is a 31 y.o. [id number] at 67w5dbeing seen today for ongoing prenatal care.  She is currently monitored for the following issues for this low-risk pregnancy and has Hx of migraines; Thrombocytosis; and Supervision of normal pregnancy on their problem list. ? ?Patient reports no complaints.  Contractions: Irritability. Vag. Bleeding: None.  Movement: Present. Denies leaking of fluid.  ? ?The following portions of the patient's history were reviewed and updated as appropriate: allergies, current medications, past family history, past medical history, past social history, past surgical history and problem list. Problem list updated. ? ?Objective:  ? ?Vitals:  ? 02/22/22 0837  ?BP: 115/70  ?Pulse: 99  ?Weight: 151 lb 9.6 oz (68.8 kg)  ? ? ?Fetal Status: Fetal Heart Rate (bpm): 124   Movement: Present    ? ?General:  Alert, oriented and cooperative. Patient is in no acute distress.  ?Skin: Skin is warm and dry. No rash noted.   ?Cardiovascular: Normal heart rate noted  ?Respiratory: Normal respiratory effort, no problems with respiration noted  ?Abdomen: Soft, gravid, appropriate for gestational age. Pain/Pressure: Present     ?Pelvic:  Cervical exam deferred        ?Extremities: Normal range of motion.  Edema: Trace  ?Mental Status: Normal mood and affect. Normal behavior. Normal judgment and thought content.  ? ?Urinalysis:     ? ?Assessment and Plan:  ?Pregnancy: [id number] [redacted]w[redacted]d ?1. Supervision of other normal pregnancy, antepartum ?Rx: ?- Glucose Tolerance, 2 Hours w/1 Hour ?- CBC ?- HIV Antibody (routine testing w rflx) ?- RPR ? ?Preterm labor symptoms and general obstetric precautions including but not limited to vaginal bleeding, contractions, leaking of fluid and fetal movement were reviewed in detail with the patient. ?Please refer to After Visit Summary for other counseling recommendations.  ? ?Return in about 1 week (around 03/01/2022) for [place]? ? ?HaShelly BombardMD   ?02/22/22  ?

## 2022-02-23 LAB — CBC
Hematocrit: 30.8 % — ABNORMAL LOW (ref 34.0–46.6)
Hemoglobin: 10.2 g/dL — ABNORMAL LOW (ref 11.1–15.9)
MCH: 28.7 pg (ref 26.6–33.0)
MCHC: 33.1 g/dL (ref 31.5–35.7)
MCV: 87 fL (ref 79–97)
Platelets: 369 10*3/uL (ref 150–450)
RBC: 3.55 x10E6/uL — ABNORMAL LOW (ref 3.77–5.28)
RDW: 13.6 % (ref 11.7–15.4)
WBC: 6.5 10*3/uL (ref 3.4–10.8)

## 2022-02-23 LAB — GLUCOSE TOLERANCE, 2 HOURS W/ 1HR
Glucose, 1 hour: 123 mg/dL (ref 70–179)
Glucose, 2 hour: 86 mg/dL (ref 70–152)
Glucose, Fasting: 74 mg/dL (ref 70–91)

## 2022-02-23 LAB — HIV ANTIBODY (ROUTINE TESTING W REFLEX): HIV Screen 4th Generation wRfx: NONREACTIVE

## 2022-02-23 LAB — RPR: RPR Ser Ql: NONREACTIVE

## 2022-03-01 ENCOUNTER — Other Ambulatory Visit: Payer: Self-pay | Admitting: Obstetrics

## 2022-03-01 DIAGNOSIS — O99013 Anemia complicating pregnancy, third trimester: Secondary | ICD-10-CM

## 2022-03-01 MED ORDER — IRON POLYSACCH CMPLX-B12-FA 150-0.025-1 MG PO CAPS: 1.0000 | ORAL_CAPSULE | ORAL | 5 refills | Status: AC

## 2022-03-03 ENCOUNTER — Ambulatory Visit (INDEPENDENT_AMBULATORY_CARE_PROVIDER_SITE_OTHER): Payer: Medicaid Other | Admitting: Obstetrics

## 2022-03-03 ENCOUNTER — Encounter: Payer: Self-pay | Admitting: Obstetrics

## 2022-03-03 ENCOUNTER — Other Ambulatory Visit (HOSPITAL_COMMUNITY)
Admission: RE | Admit: 2022-03-03 | Discharge: 2022-03-03 | Disposition: A | Discharge status: Home / Self Care | Payer: Medicaid Other | Source: Ambulatory Visit | Attending: Obstetrics | Admitting: Obstetrics

## 2022-03-03 VITALS — BP 109/68 | HR 89 | Wt 152.0 lb

## 2022-03-03 DIAGNOSIS — Z3A37 37 weeks gestation of pregnancy: Secondary | ICD-10-CM | POA: Diagnosis not present

## 2022-03-03 DIAGNOSIS — O98813 Other maternal infectious and parasitic diseases complicating pregnancy, third trimester: Secondary | ICD-10-CM | POA: Diagnosis not present

## 2022-03-03 DIAGNOSIS — B3731 Acute candidiasis of vulva and vagina: Secondary | ICD-10-CM | POA: Diagnosis not present

## 2022-03-03 DIAGNOSIS — Z3483 Encounter for supervision of other normal pregnancy, third trimester: Secondary | ICD-10-CM | POA: Diagnosis present

## 2022-03-03 DIAGNOSIS — Z3480 Encounter for supervision of other normal pregnancy, unspecified trimester: Secondary | ICD-10-CM | POA: Insufficient documentation

## 2022-03-03 DIAGNOSIS — B9689 Other specified bacterial agents as the cause of diseases classified elsewhere: Secondary | ICD-10-CM | POA: Diagnosis not present

## 2022-03-03 DIAGNOSIS — O23593 Infection of other part of genital tract in pregnancy, third trimester: Secondary | ICD-10-CM | POA: Diagnosis not present

## 2022-03-03 NOTE — Progress Notes (Signed)
Subjective:  Erin Torres is a 31 y.o. W4R1540 at 37w0dbeing seen today for ongoing prenatal care.  She is currently monitored for the following issues for this low-risk pregnancy and has Hx of migraines; Thrombocytosis; and Supervision of normal pregnancy on their problem list.  Patient reports no complaints.  Contractions: Irregular. Vag. Bleeding: None.  Movement: Present. Denies leaking of fluid.   The following portions of the patient's history were reviewed and updated as appropriate: allergies, current medications, past family history, past medical history, past social history, past surgical history and problem list. Problem list updated.  Objective:   Vitals:   03/03/22 1038  BP: 109/68  Pulse: 89  Weight: 152 lb (68.9 kg)    Fetal Status:     Movement: Present     General:  Alert, oriented and cooperative. Patient is in no acute distress.  Skin: Skin is warm and dry. No rash noted.   Cardiovascular: Normal heart rate noted  Respiratory: Normal respiratory effort, no problems with respiration noted  Abdomen: Soft, gravid, appropriate for gestational age. Pain/Pressure: Present     Pelvic:  Cervical exam deferred        Extremities: Normal range of motion.     Mental Status: Normal mood and affect. Normal behavior. Normal judgment and thought content.   Urinalysis:      Assessment and Plan:  Pregnancy: [id number] 316w0d1. Supervision of other normal pregnancy, antepartum Rx: - Culture, beta strep (group b only) - Cervicovaginal ancillary only( [place])  Term labor symptoms and general obstetric precautions including but not limited to vaginal bleeding, contractions, leaking of fluid and fetal movement were reviewed in detail with the patient. Please refer to After Visit Summary for other counseling recommendations.   Return in about 1 week (around 03/10/2022) for ROB.   HaShelly BombardMD  03/03/22

## 2022-03-07 LAB — CULTURE, BETA STREP (GROUP B ONLY): Strep Gp B Culture: NEGATIVE

## 2022-03-08 ENCOUNTER — Telehealth (INDEPENDENT_AMBULATORY_CARE_PROVIDER_SITE_OTHER): Payer: Medicaid Other | Admitting: Student

## 2022-03-08 DIAGNOSIS — Z3480 Encounter for supervision of other normal pregnancy, unspecified trimester: Secondary | ICD-10-CM

## 2022-03-08 DIAGNOSIS — Z3A37 37 weeks gestation of pregnancy: Secondary | ICD-10-CM

## 2022-03-08 LAB — CERVICOVAGINAL ANCILLARY ONLY
Bacterial Vaginitis (gardnerella): POSITIVE — AB
Candida Glabrata: NEGATIVE
Candida Vaginitis: POSITIVE — AB
Chlamydia: NEGATIVE
Comment: NEGATIVE
Comment: NEGATIVE
Comment: NEGATIVE
Comment: NEGATIVE
Comment: NEGATIVE
Comment: NORMAL
Neisseria Gonorrhea: NEGATIVE
Trichomonas: NEGATIVE

## 2022-03-08 NOTE — Progress Notes (Signed)
   OBSTETRICS PRENATAL VIRTUAL VISIT ENCOUNTER NOTE  Provider location: [facility]   Patient location: Home  I connected with Erin Torres on 03/08/22 at  2:30 PM EDT by MyChart Video Encounter and verified that I am speaking with the correct person using two identifiers. I discussed the limitations, risks, security and privacy concerns of performing an evaluation and management service virtually and the availability of in person appointments. I also discussed with the patient that there may be a patient responsible charge related to this service. The patient expressed understanding and agreed to proceed. Subjective:  Erin Torres is a 31 y.o. [id number] at 106w5dbeing seen today for ongoing prenatal care.  She is currently monitored for the following issues for this low-risk pregnancy and has Hx of migraines; Thrombocytosis; and Supervision of normal pregnancy on their problem list.  Patient reports  sore throat that started this morning . Son had Strep Throat this week.  Contractions: Irregular. Vag. Bleeding: None.  Movement: Present. Denies any leaking of fluid.   The following portions of the patient's history were reviewed and updated as appropriate: allergies, current medications, past family history, past medical history, past social history, past surgical history and problem list.   Objective:  There were no vitals filed for this visit.  Fetal Status:     Movement: Present     General:  Alert, oriented and cooperative. Patient is in no acute distress.  Respiratory: Normal respiratory effort, no problems with respiration noted  Mental Status: Normal mood and affect. Normal behavior. Normal judgment and thought content.  Rest of physical exam deferred due to type of encounter  Imaging: No results found.  Assessment and Plan:  Pregnancy: [id number] 351w5d. Supervision of other normal pregnancy, antepartum -Doing well, good fetal  movement -Plan for continued routine care  2. [redacted] weeks gestation of pregnancy -GBS negative   Term labor symptoms and general obstetric precautions including but not limited to vaginal bleeding, contractions, leaking of fluid and fetal movement were reviewed in detail with the patient. I discussed the assessment and treatment plan with the patient. The patient was provided an opportunity to ask questions and all were answered. The patient agreed with the plan and demonstrated an understanding of the instructions. The patient was advised to call back or seek an in-person office evaluation/[facility] or concerning [facility]. Please refer to After Visit Summary for other counseling recommendations.   I provided 15 minutes of face-to-face time during this encounter.  Return in about 1 week (around 03/15/2022) for IN-PERSON, LOB.  Future Appointments  Date Time Provider [place]6/04/2022  2:30 PM HaShelly BombardMD [place]    [place] EbbsNP [facility]

## 2022-03-08 NOTE — Progress Notes (Addendum)
I connected with  Erin Torres on 03/08/22 by a video enabled telemedicine application and verified that I am speaking with the correct person using two identifiers.   I discussed the limitations of evaluation and management by telemedicine. The patient expressed understanding and agreed to proceed.   MyChart OB, c/o sore throat, loss of voice, painful throat.  Her Son had Strep Throat last Week.  She is unable to check her vitals at this time because she is not at home.

## 2022-03-09 ENCOUNTER — Other Ambulatory Visit: Payer: Self-pay | Admitting: Obstetrics

## 2022-03-09 DIAGNOSIS — B3731 Acute candidiasis of vulva and vagina: Secondary | ICD-10-CM

## 2022-03-09 DIAGNOSIS — B9689 Other specified bacterial agents as the cause of diseases classified elsewhere: Secondary | ICD-10-CM

## 2022-03-09 DIAGNOSIS — K029 Dental caries, unspecified: Secondary | ICD-10-CM

## 2022-03-09 MED ORDER — METRONIDAZOLE 500 MG PO TABS: 500.0000 mg | ORAL_TABLET | Freq: Two times a day (BID) | ORAL | 2 refills | Status: DC

## 2022-03-09 MED ORDER — TERCONAZOLE 0.8 % VA CREA: 1.0000 | TOPICAL_CREAM | Freq: Every day | VAGINAL | 0 refills | Status: DC

## 2022-03-16 ENCOUNTER — Ambulatory Visit (INDEPENDENT_AMBULATORY_CARE_PROVIDER_SITE_OTHER): Payer: Medicaid Other | Admitting: Obstetrics

## 2022-03-16 ENCOUNTER — Encounter: Payer: Self-pay | Admitting: Obstetrics

## 2022-03-16 DIAGNOSIS — Z3480 Encounter for supervision of other normal pregnancy, unspecified trimester: Secondary | ICD-10-CM | POA: Diagnosis not present

## 2022-03-16 NOTE — Progress Notes (Signed)
Pt reports fetal movement, denies pain.  

## 2022-03-16 NOTE — Progress Notes (Signed)
Subjective:  Erin Torres is a 31 y.o. [id number] at 60w6dbeing seen today for ongoing prenatal care.  She is currently monitored for the following issues for this low-risk pregnancy and has Hx of migraines; Thrombocytosis; and Supervision of normal pregnancy on their problem list.  Patient reports no complaints.  Contractions: Not present. Vag. Bleeding: None.  Movement: Present. Denies leaking of fluid.   The following portions of the patient's history were reviewed and updated as appropriate: allergies, current medications, past family history, past medical history, past social history, past surgical history and problem list. Problem list updated.  Objective:   Vitals:   03/16/22 1457  BP: 105/68  Pulse: 71  Weight: 153 lb (69.4 kg)    Fetal Status: Fetal Heart Rate (bpm): 125   Movement: Present     General:  Alert, oriented and cooperative. Patient is in no acute distress.  Skin: Skin is warm and dry. No rash noted.   Cardiovascular: Normal heart rate noted  Respiratory: Normal respiratory effort, no problems with respiration noted  Abdomen: Soft, gravid, appropriate for gestational age. Pain/Pressure: Absent     Pelvic:  Cervical exam deferred        Extremities: Normal range of motion.  Edema: Trace  Mental Status: Normal mood and affect. Normal behavior. Normal judgment and thought content.   Urinalysis:      Assessment and Plan:  Pregnancy: [id number] 344w6d1. Supervision of other normal pregnancy, antepartum   Term labor symptoms and general obstetric precautions including but not limited to vaginal bleeding, contractions, leaking of fluid and fetal movement were reviewed in detail with the patient. Please refer to After Visit Summary for other counseling recommendations.   Return in about 1 week (around 03/23/2022) for [place]  HaShelly BombardMD  03/16/22

## 2022-03-18 ENCOUNTER — Encounter (HOSPITAL_COMMUNITY): Payer: Self-pay | Admitting: Obstetrics and Gynecology

## 2022-03-18 ENCOUNTER — Other Ambulatory Visit: Payer: Self-pay

## 2022-03-18 ENCOUNTER — Inpatient Hospital Stay (HOSPITAL_COMMUNITY)
Admission: AD | Admit: 2022-03-18 | Discharge: 2022-03-20 | DRG: 807 | Disposition: A | Discharge status: Home / Self Care | Payer: Medicaid Other | Attending: Obstetrics and Gynecology | Admitting: Obstetrics and Gynecology

## 2022-03-18 DIAGNOSIS — D509 Iron deficiency anemia, unspecified: Secondary | ICD-10-CM | POA: Diagnosis present

## 2022-03-18 DIAGNOSIS — O26893 Other specified pregnancy related conditions, third trimester: Secondary | ICD-10-CM | POA: Diagnosis present

## 2022-03-18 DIAGNOSIS — Z3A39 39 weeks gestation of pregnancy: Secondary | ICD-10-CM

## 2022-03-18 DIAGNOSIS — Z3490 Encounter for supervision of normal pregnancy, unspecified, unspecified trimester: Secondary | ICD-10-CM

## 2022-03-18 DIAGNOSIS — O9902 Anemia complicating childbirth: Secondary | ICD-10-CM | POA: Diagnosis present

## 2022-03-18 DIAGNOSIS — Z20822 Contact with and (suspected) exposure to covid-19: Secondary | ICD-10-CM | POA: Diagnosis present

## 2022-03-18 DIAGNOSIS — Z3480 Encounter for supervision of other normal pregnancy, unspecified trimester: Principal | ICD-10-CM

## 2022-03-18 LAB — CBC
HCT: 29.9 % — ABNORMAL LOW (ref 36.0–46.0)
Hemoglobin: 9.8 g/dL — ABNORMAL LOW (ref 12.0–15.0)
MCH: 28.7 pg (ref 26.0–34.0)
MCHC: 32.8 g/dL (ref 30.0–36.0)
MCV: 87.7 fL (ref 80.0–100.0)
Platelets: 377 10*3/uL (ref 150–400)
RBC: 3.41 MIL/uL — ABNORMAL LOW (ref 3.87–5.11)
RDW: 14.4 % (ref 11.5–15.5)
WBC: 6.6 10*3/uL (ref 4.0–10.5)
nRBC: 0 % (ref 0.0–0.2)

## 2022-03-18 LAB — RESP PANEL BY RT-PCR (FLU A&B, COVID) ARPGX2
Influenza A by PCR: NEGATIVE
Influenza B by PCR: NEGATIVE
SARS Coronavirus 2 by RT PCR: NEGATIVE

## 2022-03-18 LAB — TYPE AND SCREEN
ABO/RH(D): A POS
Antibody Screen: NEGATIVE

## 2022-03-18 MED ORDER — ONDANSETRON HCL 4 MG/2ML IJ SOLN
4.0000 mg | Freq: Four times a day (QID) | INTRAMUSCULAR | Status: DC | PRN
Start: 2022-03-18 — End: 2022-03-18

## 2022-03-18 MED ORDER — LACTATED RINGERS IV SOLN: INTRAVENOUS | Status: DC

## 2022-03-18 MED ORDER — SOD CITRATE-CITRIC ACID 500-334 MG/5ML PO SOLN: 30.0000 mL | ORAL | Status: DC | PRN

## 2022-03-18 MED ORDER — FLEET ENEMA 7-19 GM/118ML RE ENEM: 1.0000 | ENEMA | RECTAL | Status: DC | PRN

## 2022-03-18 MED ORDER — LACTATED RINGERS IV SOLN: 500.0000 mL | INTRAVENOUS | Status: DC | PRN

## 2022-03-18 MED ORDER — OXYTOCIN-SODIUM CHLORIDE 30-0.9 UT/500ML-% IV SOLN
2.5000 [IU]/h | INTRAVENOUS | Status: DC
  Administered 2022-03-18: 2.5 [IU]/h via INTRAVENOUS
  Filled 2022-03-18: qty 500

## 2022-03-18 MED ORDER — OXYCODONE-ACETAMINOPHEN 5-325 MG PO TABS: 2.0000 | ORAL_TABLET | ORAL | Status: DC | PRN

## 2022-03-18 MED ORDER — LIDOCAINE HCL (PF) 1 % IJ SOLN: 30.0000 mL | INTRAMUSCULAR | Status: DC | PRN

## 2022-03-18 MED ORDER — TRANEXAMIC ACID-NACL 1000-0.7 MG/100ML-% IV SOLN
INTRAVENOUS | Status: AC
  Administered 2022-03-18: 1000 mg
  Filled 2022-03-18: qty 100

## 2022-03-18 MED ORDER — FENTANYL CITRATE (PF) 100 MCG/2ML IJ SOLN
50.0000 ug | INTRAMUSCULAR | Status: DC | PRN
  Administered 2022-03-18: 50 ug via INTRAVENOUS

## 2022-03-18 MED ORDER — ACETAMINOPHEN 325 MG PO TABS: 650.0000 mg | ORAL_TABLET | ORAL | Status: DC | PRN

## 2022-03-18 MED ORDER — OXYCODONE-ACETAMINOPHEN 5-325 MG PO TABS: 1.0000 | ORAL_TABLET | ORAL | Status: DC | PRN

## 2022-03-18 MED ORDER — OXYTOCIN BOLUS FROM INFUSION
333.0000 mL | Freq: Once | INTRAVENOUS | Status: AC
  Administered 2022-03-18: 333 mL via INTRAVENOUS

## 2022-03-18 MED ORDER — TRANEXAMIC ACID-NACL 1000-0.7 MG/100ML-% IV SOLN: 1000.0000 mg | INTRAVENOUS | Status: DC

## 2022-03-18 MED ORDER — FENTANYL CITRATE (PF) 100 MCG/2ML IJ SOLN
INTRAMUSCULAR | Status: AC
  Administered 2022-03-18: 50 ug via INTRAVENOUS
  Filled 2022-03-18: qty 2

## 2022-03-18 NOTE — H&P (Signed)
OBSTETRIC ADMISSION HISTORY AND PHYSICAL  Erin Torres is a 31 y.o. female G6P4014 with IUP at [redacted]w[redacted]d presenting for spontaneous onset of labor. She reports +FMs. No LOF, VB, blurry vision, headaches, peripheral edema, or RUQ pain. She plans on breast and formula feeding. She requests depo for birth control if no epidural, ppIUD if epidural in place.  Dating: By 13wk U/S --->  Estimated Date of Delivery: 03/24/22  Sono:  @[redacted]w[redacted]d, normal anatomy, cephalic presentation, 1018g, 90%ile, EFW 2lb 4oz  Prenatal History/Complications: None, has had 4 previous uncomplicated deliveries  Past Medical History: Past Medical History:  Diagnosis Date   Anemia    Medical history non-contributory    No pertinent past medical history    Past Surgical History: Past Surgical History:  Procedure Laterality Date   NO PAST SURGERIES     Obstetrical History: OB History     Gravida  6   Para  4   Term  4   Preterm      AB  1   Living  4      SAB  1   IAB      Ectopic      Multiple  0   Live Births  4          Social History: Social History   Socioeconomic History   Marital status: Single    Spouse name: Not on file   Number of children: Not on file   Years of education: Not on file   Highest education level: Not on file  Occupational History   Not on file  Tobacco Use   Smoking status: Never   Smokeless tobacco: Never  Vaping Use   Vaping Use: Never used  Substance and Sexual Activity   Alcohol use: No    Alcohol/week: 0.0 standard drinks of alcohol   Drug use: No   Sexual activity: Yes    Partners: Male    Birth control/protection: None  Other Topics Concern   Not on file  Social History Narrative   Not on file   Social Determinants of Health   Financial Resource Strain: Not on file  Food Insecurity: Not on file  Transportation Needs: Not on file  Physical Activity: Not on file  Stress: Not on file  Social Connections: Not on file   Family  History: Family History  Problem Relation Age of Onset   Hypertension Maternal Grandmother    Hypertension Paternal Grandmother    Diabetes Paternal Grandmother    Anesthesia problems Neg Hx    Allergies: No Known Allergies  Medications Prior to Admission  Medication Sig Dispense Refill Last Dose   Blood Pressure Monitoring (BLOOD PRESSURE KIT) DEVI 1 kit by Does not apply route once a week. 1 each 0    Iron Polysacch Cmplx-B12-FA 150-0.025-1 MG CAPS Take 1 capsule by mouth every other day. 30 capsule 5    metroNIDAZOLE (FLAGYL) 500 MG tablet Take 1 tablet (500 mg total) by mouth 2 (two) times daily. (Patient not taking: Reported on 03/16/2022) 14 tablet 2    Prenat-FeCbn-FeAsp-Meth-FA-DHA (PRENATE MINI) 18-0.6-0.4-350 MG CAPS Take 1 capsule by mouth daily. 30 capsule 11    terconazole (TERAZOL 3) 0.8 % vaginal cream Place 1 applicator vaginally at bedtime. (Patient not taking: Reported on 03/16/2022) 20 g 0     Review of Systems: All systems reviewed and negative except as stated in HPI  PE: Blood pressure 114/65, pulse 81, temperature 98.3 F (36.8 C), temperature source Oral, resp.   rate 16, last menstrual period 07/03/2021, SpO2 99 %, unknown if currently breastfeeding. General appearance: alert, cooperative, appears stated age, and no distress Lungs: regular rate and effort Heart: regular rate  Abdomen: soft, non-tender Extremities: Homans sign is negative, no sign of DVT Presentation: cephalic EFM: 416 bpm, moderate variability, 15x15 accels, no decels Toco: q3-78mn, less intense while in bed Dilation: 5.5 Effacement (%): 70 Station: -3 Exam by:: jolynn  Prenatal labs: ABO, Rh: A/Positive/-- (12/30 1148) Antibody: Negative (12/30 1148) Rubella: 1.68 (12/30 1148) RPR: Non Reactive (05/16 0935)  HBsAg: Negative (12/30 1148)  HIV: Non Reactive (05/16 0935)  GBS: Negative/-- (05/25 1051)  2 hr GTT: Normal  Prenatal Transfer Tool  Maternal Diabetes: No Genetic  Screening: Normal Maternal Ultrasounds/Referrals: Normal Fetal Ultrasounds or other Referrals:  None Maternal Substance Abuse:  No Significant Maternal Medications:  None Significant Maternal Lab Results: Group B Strep negative  No results found for this or any previous visit (from the past 24 hour(s)).  Patient Active Problem List   Diagnosis Date Noted   Supervision of normal pregnancy 09/20/2021   Thrombocytosis 07/19/2019   Hx of migraines 12/17/2014    Assessment: Erin BERRYHILLis a 31y.o. [id number] 365w1dere for spontaneous onset of labor  1. Labor: active, progressing well  2. FWB: Cat 1 3. Pain: not feeling much pain, planning unmedicated delivery 4. GBS: Negative   Plan: Admit to L&D for expectant management of active labor at 3927w1d saline lock in place (pt fine as long as not continuously hooked up) - pt ok to eat until pitocin titration started or epidural ordered - intermittent monitoring ok while ambulating - encouraged pt to ambulate and use the birthing ball to help facilitate stronger contractions  Erin CarinaNM  03/18/2022, 10:53 AM

## 2022-03-18 NOTE — MAU Note (Signed)
Pt declining IV access at this time. Pt okay with phlebotomy drawing labs. CNM and LD charge informed.

## 2022-03-18 NOTE — MAU Note (Signed)
Erin Torres is a 31 y.o. at 54w1dhere in MAU reporting: started having contractions at 0745 today. States when she was up walking they were every 5 min and were mild. Now contractions seem further apart. Seeing some bleeding, states mucus bleeding. No LOF. +FM  Onset of complaint: today  Pain score: mild/moderate  Vitals:   03/18/22 0913  BP: 114/65  Pulse: 81  Resp: 16  Temp: 98.3 F (36.8 C)  SpO2: 99%     FHT: EFM applied in room  Lab orders placed from triage: none

## 2022-03-18 NOTE — Discharge Summary (Signed)
Postpartum Discharge Summary     Patient Name: Erin Torres DOB: 02/19/1991 MRN: [id number]  Date of admission: 03/18/2022 Delivery date:03/18/2022  Delivering provider: Renard Matter  Date of discharge: 03/20/2022  Admitting diagnosis: Normal labor and delivery [O80] Intrauterine pregnancy: [redacted]w[redacted]d    Secondary diagnosis:  Principal Problem:   Normal labor and delivery Active Problems:   Supervision of normal pregnancy   Vaginal delivery  Additional problems: Symptomatic Anemia s/p Iron infusion    Discharge diagnosis: Term Pregnancy Delivered                                              Post partum procedures: None Augmentation: AROM Complications: None  Hospital course: Onset of Labor With Vaginal Delivery      31y.o. yo [id number] 339w1das admitted in Active Labor on 03/18/2022. Patient had an uncomplicated labor course as follows:  Membrane Rupture Time/Date: 9:36 PM ,03/18/2022   Delivery Method:Vaginal, Spontaneous  Episiotomy: None  Lacerations:  None  Patient had an uncomplicated postpartum course.  She is ambulating, tolerating a regular diet, passing flatus, and urinating well. Patient is discharged home in stable condition on 03/20/22.  Newborn Data: Birth date:03/18/2022  Birth time:10:01 PM  Gender:Female  Living status:Living  Apgars:8 ,9  Weight:3420 g   Magnesium Sulfate received: No BMZ received: No Rhophylac:N/A MMR:N/A T-DaP: declined Flu: No Transfusion:No  Physical exam  Vitals:   03/18/22 1913 03/18/22 2225 03/18/22 2231 03/18/22 2245  BP: 112/66 (!) 108/25 121/76 122/83  Pulse: 99 97 88 79  Resp: 18     Temp: 97.8 F (36.6 C)     TempSrc: Oral     SpO2:       General: alert, cooperative, and no distress Lochia: appropriate Uterine Fundus: firm Incision: N/A DVT Evaluation: No evidence of DVT seen on physical exam. Negative Homan's sign. Labs: Lab Results  Component Value Date   WBC 6.6 03/18/2022   HGB 9.8 (L) 03/18/2022    HCT 29.9 (L) 03/18/2022   MCV 87.7 03/18/2022   PLT 377 03/18/2022      Latest Ref Rng & Units 12/31/2019    1:44 PM  CMP  Glucose 65 - 99 mg/dL 79   BUN 6 - 20 mg/dL 5   Creatinine 0.57 - 1.00 mg/dL 0.68   Sodium 134 - 144 mmol/L 137   Potassium 3.5 - 5.2 mmol/L 4.0   Chloride 96 - 106 mmol/L 104   CO2 20 - 29 mmol/L 23   Calcium 8.7 - 10.2 mg/dL 8.8   Total Protein 6.0 - 8.5 g/dL 6.2   Total Bilirubin 0.0 - 1.2 mg/dL 0.3   Alkaline Phos 39 - 117 IU/L 132   AST 0 - 40 IU/L 17   ALT 0 - 32 IU/L 9    Edinburgh Score:    05/07/2020    9:33 AM  Edinburgh Postnatal Depression Scale Screening Tool  I have been able to laugh and see the funny side of things. 0  I have looked forward with enjoyment to things. 0  I have blamed myself unnecessarily when things went wrong. 0  I have been anxious or worried for no good reason. 0  I have felt scared or panicky for no good reason. 0  Things have been getting on top of me. 0  I have been so  unhappy that I have had difficulty sleeping. 0  I have felt sad or miserable. 0  I have been so unhappy that I have been crying. 0  The thought of harming myself has occurred to me. 0  Edinburgh Postnatal Depression Scale Total 0     After visit meds:  Allergies as of 03/20/2022   No Known Allergies      Medication List     STOP taking these medications    metroNIDAZOLE 500 MG tablet Commonly known as: FLAGYL   terconazole 0.8 % vaginal cream Commonly known as: TERAZOL 3       TAKE these medications    acetaminophen 325 MG tablet Commonly known as: Tylenol Take 2 tablets (650 mg total) by mouth every 4 (four) hours.   Blood Pressure Kit Devi 1 kit by Does not apply route once a week.   ibuprofen 600 MG tablet Commonly known as: ADVIL Take 1 tablet (600 mg total) by mouth every 6 (six) hours.   Iron Polysacch Cmplx-B12-FA 150-0.025-1 MG Caps Take 1 capsule by mouth every other day.   Prenate Mini 18-0.6-0.4-350 MG  Caps Take 1 capsule by mouth daily.         Discharge home in stable condition Infant Feeding: Bottle and Breast Infant Disposition:home with mother Discharge instruction: per After Visit Summary and Postpartum booklet. Activity: Advance as tolerated. Pelvic rest for 6 weeks.  Diet: routine diet Future Appointments: Future Appointments  Date Time Provider [place]  03/23/2022  2:30 PM Chancy Milroy, MD [facility] None   Follow up Visit: Message sent to [facility] by Dr. Cy Blamer on 6/10  Please schedule this patient for a In person postpartum visit in 1 week with the following provider: Any provider. Additional Postpartum F/U: None   Low risk pregnancy complicated by:  None Delivery mode:  Vaginal, Spontaneous  Anticipated Birth Control:  Depo prior to hospital discharge   Mallie Snooks, [place], MSN, CNM Certified Nurse Midwife, Product/process development scientist for [employer], [place]

## 2022-03-19 LAB — CBC
HCT: 26.3 % — ABNORMAL LOW (ref 36.0–46.0)
Hemoglobin: 8.4 g/dL — ABNORMAL LOW (ref 12.0–15.0)
MCH: 28.4 pg (ref 26.0–34.0)
MCHC: 31.9 g/dL (ref 30.0–36.0)
MCV: 88.9 fL (ref 80.0–100.0)
Platelets: 349 10*3/uL (ref 150–400)
RBC: 2.96 MIL/uL — ABNORMAL LOW (ref 3.87–5.11)
RDW: 14.3 % (ref 11.5–15.5)
WBC: 12.8 10*3/uL — ABNORMAL HIGH (ref 4.0–10.5)
nRBC: 0 % (ref 0.0–0.2)

## 2022-03-19 LAB — RPR: RPR Ser Ql: NONREACTIVE

## 2022-03-19 MED ORDER — WITCH HAZEL-GLYCERIN EX PADS: 1.0000 "application " | MEDICATED_PAD | CUTANEOUS | Status: DC | PRN

## 2022-03-19 MED ORDER — SIMETHICONE 80 MG PO CHEW: 80.0000 mg | CHEWABLE_TABLET | ORAL | Status: DC | PRN

## 2022-03-19 MED ORDER — DIBUCAINE (PERIANAL) 1 % EX OINT: 1.0000 "application " | TOPICAL_OINTMENT | CUTANEOUS | Status: DC | PRN

## 2022-03-19 MED ORDER — ACETAMINOPHEN 325 MG PO TABS
650.0000 mg | ORAL_TABLET | ORAL | Status: DC
  Administered 2022-03-19 – 2022-03-20 (×7): 650 mg via ORAL
  Filled 2022-03-19 (×7): qty 2

## 2022-03-19 MED ORDER — DEXTROMETHORPHAN POLISTIREX ER 30 MG/5ML PO SUER
30.0000 mg | Freq: Two times a day (BID) | ORAL | Status: DC | PRN
  Administered 2022-03-19: 30 mg via ORAL
  Filled 2022-03-19 (×2): qty 5

## 2022-03-19 MED ORDER — MENTHOL 3 MG MT LOZG
1.0000 | LOZENGE | OROMUCOSAL | Status: DC | PRN
Start: 2022-03-19 — End: 2022-03-20

## 2022-03-19 MED ORDER — TETANUS-DIPHTH-ACELL PERTUSSIS 5-2.5-18.5 LF-MCG/0.5 IM SUSY: 0.5000 mL | PREFILLED_SYRINGE | Freq: Once | INTRAMUSCULAR | Status: DC

## 2022-03-19 MED ORDER — COCONUT OIL OIL: 1.0000 "application " | TOPICAL_OIL | Status: DC | PRN

## 2022-03-19 MED ORDER — BENZOCAINE-MENTHOL 20-0.5 % EX AERO
1.0000 "application " | INHALATION_SPRAY | CUTANEOUS | Status: DC | PRN
  Administered 2022-03-19: 1 via TOPICAL
  Filled 2022-03-19: qty 56

## 2022-03-19 MED ORDER — ONDANSETRON HCL 4 MG/2ML IJ SOLN: 4.0000 mg | INTRAMUSCULAR | Status: DC | PRN

## 2022-03-19 MED ORDER — OXYCODONE HCL 5 MG PO TABS: 5.0000 mg | ORAL_TABLET | Freq: Four times a day (QID) | ORAL | Status: DC | PRN

## 2022-03-19 MED ORDER — MEASLES, MUMPS & RUBELLA VAC IJ SOLR: 0.5000 mL | Freq: Once | INTRAMUSCULAR | Status: DC

## 2022-03-19 MED ORDER — ONDANSETRON HCL 4 MG PO TABS: 4.0000 mg | ORAL_TABLET | ORAL | Status: DC | PRN

## 2022-03-19 MED ORDER — SODIUM CHLORIDE 0.9 % IV SOLN
500.0000 mg | Freq: Once | INTRAVENOUS | Status: AC
  Administered 2022-03-19: 500 mg via INTRAVENOUS
  Filled 2022-03-19 (×2): qty 25

## 2022-03-19 MED ORDER — SENNOSIDES-DOCUSATE SODIUM 8.6-50 MG PO TABS
2.0000 | ORAL_TABLET | Freq: Every day | ORAL | Status: DC
  Administered 2022-03-19 – 2022-03-20 (×2): 2 via ORAL
  Filled 2022-03-19 (×2): qty 2

## 2022-03-19 MED ORDER — IBUPROFEN 600 MG PO TABS
600.0000 mg | ORAL_TABLET | Freq: Four times a day (QID) | ORAL | Status: DC
  Administered 2022-03-19 – 2022-03-20 (×6): 600 mg via ORAL
  Filled 2022-03-19 (×8): qty 1

## 2022-03-19 MED ORDER — PRENATAL MULTIVITAMIN CH
1.0000 | ORAL_TABLET | Freq: Every day | ORAL | Status: DC
  Administered 2022-03-19 – 2022-03-20 (×2): 1 via ORAL
  Filled 2022-03-19 (×2): qty 1

## 2022-03-19 MED ORDER — MEDROXYPROGESTERONE ACETATE 150 MG/ML IM SUSP
150.0000 mg | INTRAMUSCULAR | Status: AC | PRN
  Administered 2022-03-20: 150 mg via INTRAMUSCULAR
  Filled 2022-03-19: qty 1

## 2022-03-19 MED ORDER — DIPHENHYDRAMINE HCL 25 MG PO CAPS: 25.0000 mg | ORAL_CAPSULE | Freq: Four times a day (QID) | ORAL | Status: DC | PRN

## 2022-03-19 NOTE — Plan of Care (Signed)
  Problem: Education: Goal: Knowledge of General Education information will improve Description: Including pain rating scale, medication(s)/side effects and non-pharmacologic comfort measures Outcome: Completed/Met   Problem: Health Behavior/Discharge Planning: Goal: Ability to manage health-related needs will improve Outcome: Completed/Met

## 2022-03-19 NOTE — Plan of Care (Signed)
  Problem: Education: Goal: Knowledge of General Education information will improve Description: Including pain rating scale, medication(s)/side effects and non-pharmacologic comfort measures Outcome: Completed/Met

## 2022-03-19 NOTE — Plan of Care (Signed)
  Problem: Education: Goal: Knowledge of condition will improve Outcome: Completed/Met   Problem: Activity: Goal: Will verbalize the importance of balancing activity with adequate rest periods Outcome: Completed/Met Goal: Ability to tolerate increased activity will improve Outcome: Completed/Met   Problem: Role Relationship: Goal: Ability to demonstrate positive interaction with newborn will improve Outcome: Completed/Met   Problem: Skin Integrity: Goal: Demonstration of wound healing without infection will improve Outcome: Completed/Met   Problem: Education: Goal: Knowledge of condition will improve Outcome: Completed/Met   Problem: Activity: Goal: Will verbalize the importance of balancing activity with adequate rest periods Outcome: Completed/Met Goal: Ability to tolerate increased activity will improve Outcome: Completed/Met   Problem: Coping: Goal: Ability to identify and utilize available resources and services will improve Outcome: Completed/Met   Problem: Life Cycle: Goal: Chance of risk for complications during the postpartum period will decrease Outcome: Completed/Met   Problem: Role Relationship: Goal: Ability to demonstrate positive interaction with newborn will improve Outcome: Completed/Met   Problem: Skin Integrity: Goal: Demonstration of wound healing without infection will improve Outcome: Completed/Met

## 2022-03-19 NOTE — Progress Notes (Signed)
POSTPARTUM PROGRESS NOTE  Subjective: Erin Torres is a 31 y.o. [id number] PPD#1 s/p SVD at [redacted]w[redacted]d  She reports she doing well. No acute events overnight. She denies any problems with ambulating, voiding or po intake. Denies nausea or vomiting. She has  passed flatus. Pain is well controlled.  Lochia is scant.  Objective: Blood pressure 105/75, pulse 90, temperature 98 F (36.7 C), temperature source Oral, resp. rate 18, last menstrual period 07/03/2021, SpO2 99 %, unknown if currently breastfeeding.  Physical Exam:  General: alert, cooperative and no distress Chest: no respiratory distress Abdomen: soft, non-tender  Uterine Fundus: firm, appropriately tender Extremities: No calf swelling or tenderness  mild edema  Recent Labs    03/18/22 1047 03/19/22 0422  HGB 9.8* 8.4*  HCT 29.9* 26.3*    Assessment/Plan: Erin OSUNAis a 31y.o. [id number]#1 s/p SVD at 352w1d Routine Postpartum Care: Doing well, pain well-controlled.  -- Continue routine care, lactation support  -- Contraception: depo -- Feeding: breast and bottle  #Iron deficiency anemia HgB 9.8> 8.4. EBL ~200cc. Discussed IV iron with patient and she is amenable. Ordered for this morning.  Dispo: Plan for discharge PPD#2 given late evening delivery.  AnRenard MatterMD, MPH OB Fellow, [facility]

## 2022-03-20 MED ORDER — ACETAMINOPHEN 325 MG PO TABS
650.0000 mg | ORAL_TABLET | ORAL | 0 refills | Status: AC
Start: 2022-03-20 — End: 2022-04-19

## 2022-03-20 MED ORDER — IBUPROFEN 600 MG PO TABS: 600.0000 mg | ORAL_TABLET | Freq: Four times a day (QID) | ORAL | 0 refills | Status: AC

## 2022-03-20 NOTE — Progress Notes (Signed)
Encouraged MOB not to sleep with baby in crib. RN removed baby from bed. Educated on safe sleep.

## 2022-03-23 ENCOUNTER — Encounter: Payer: Medicaid Other | Admitting: Obstetrics and Gynecology

## 2022-03-26 ENCOUNTER — Telehealth (HOSPITAL_COMMUNITY): Payer: Self-pay

## 2022-03-26 NOTE — Telephone Encounter (Signed)
"  I'm doing fine, feeling good." Patient declines any questions or concerns about her healing.  "He is doing good. Eating well and gaining weight. He sleeps in a bedside bassinet."  RN reviewed ABC's of safe sleep with patient. Patient declines any questions or concerns about baby.  EPDS score is 2.  Sharyn Lull [facility] 06/17//2023,1700

## 2022-04-26 ENCOUNTER — Ambulatory Visit (INDEPENDENT_AMBULATORY_CARE_PROVIDER_SITE_OTHER): Payer: Medicaid Other | Admitting: Advanced Practice Midwife

## 2022-04-26 NOTE — Progress Notes (Signed)
,   [place] Partum Visit Note  Erin Torres is a 31 y.o. [phone] female who presents for a postpartum visit. She is 7 weeks postpartum following a normal spontaneous vaginal delivery.  I have fully reviewed the prenatal and intrapartum course. The delivery was at w1d gestational weeks.  Anesthesia: IV Fentanyl Postpartum course has been uncomplicated. Baby is doing well. Baby is feeding by breast. Bleeding staining only. Bowel function is normal. Bladder function is normal. Patient is not sexually active. Contraception method is Depo-Provera injections. Postpartum depression screening: negative.   The pregnancy intention screening data noted above was reviewed. Potential methods of contraception were discussed. The patient elected to proceed with Depo.   Edinburgh Postnatal Depression Scale - 04/26/22 1056       Edinburgh Postnatal Depression Scale:  In the Past 7 Days   I have been able to laugh and see the funny side of things. 0    I have looked forward with enjoyment to things. 0    I have blamed myself unnecessarily when things went wrong. 0    I have been anxious or worried for no good reason. 0    I have felt scared or panicky for no good reason. 0    Things have been getting on top of me. 0    I have been so unhappy that I have had difficulty sleeping. 0    I have felt sad or miserable. 0    I have been so unhappy that I have been crying. 0    The thought of harming myself has occurred to me. 0    Edinburgh Postnatal Depression Scale Total 0             Health Maintenance Due  Topic Date Due   COVID-19 Vaccine (1) Never done   TETANUS/TDAP  Never done    The following portions of the patient's history were reviewed and updated as appropriate: allergies, current medications, past family history, past medical history, past social history, past surgical history, and problem list.  Review of Systems Pertinent items noted in HPI and remainder of comprehensive ROS  otherwise negative.  Objective:  LMP 07/03/2021 Comment: had spotting off and on in october   VS reviewed, nursing note reviewed,  Constitutional: well developed, well nourished, no distress HEENT: normocephalic CV: normal rate Pulm/chest wall: normal effort Abdomen: soft Neuro: alert and oriented x 3 Skin: warm, dry Psych: affect normal   Assessment:   1. Postpartum care following vaginal delivery --Pt doing well, bonding well with baby, good support at home.    Plan:   Essential components of care per ACOG recommendations:  1.  Mood and well being: Patient with negative depression screening today. Reviewed local resources for support.  - Patient tobacco use? No.   - hx of drug use? No.    2. Infant care and feeding:  -Patient currently breastmilk feeding? Yes. Reviewed importance of draining breast regularly to support lactation.  -Social determinants of health (SDOH) reviewed in EPIC. No concerns   3. Sexuality, contraception and birth spacing - Patient does not want a pregnancy in the next year.  - Reviewed reproductive life planning. Reviewed contraceptive methods based on pt preferences and effectiveness.  Patient desired Hormonal Injection today.  Had first Depo injection in the hospital prior to discharge, next injection 06/07/22. --Pt considered  IUD but is happy now with Depo. --Will follow up if desires to switch.  4. Sleep and fatigue -Encouraged family/partner/community support of  4 hrs of uninterrupted sleep to help with mood and fatigue  5. Physical Recovery  - Discussed patients delivery and complications. She describes her labor as good. - Patient had a Vaginal, no problems at delivery. Patient had no laceration. Perineal healing reviewed. Patient expressed understanding - Patient has urinary incontinence? No. - Patient is safe to resume physical and sexual activity  6.  Health Maintenance - HM due items addressed Yes - Last pap smear  Diagnosis   Date Value Ref Range Status  07/18/2019   Final   - Negative for intraepithelial lesion or malignancy (NILM)  07/18/2019 Molecular only (A)  Final   Pap smear not done at today's visit.  -Breast Cancer screening indicated? No.   7. Chronic Disease/Pregnancy Condition follow up: None  - PCP follow up  Fatima Blank, [place] for [place]

## 2022-06-07 ENCOUNTER — Ambulatory Visit: Payer: Medicaid Other

## 2022-06-09 ENCOUNTER — Ambulatory Visit (INDEPENDENT_AMBULATORY_CARE_PROVIDER_SITE_OTHER): Payer: Medicaid Other | Admitting: Emergency Medicine

## 2022-06-09 VITALS — BP 118/70 | HR 76 | Ht 64.0 in | Wt 135.0 lb

## 2022-06-09 DIAGNOSIS — Z3042 Encounter for surveillance of injectable contraceptive: Secondary | ICD-10-CM

## 2022-06-09 MED ORDER — MEDROXYPROGESTERONE ACETATE 150 MG/ML IM SUSP
150.0000 mg | Freq: Once | INTRAMUSCULAR | Status: AC
  Administered 2022-06-09: 150 mg via INTRAMUSCULAR

## 2022-06-09 MED ORDER — MEDROXYPROGESTERONE ACETATE 150 MG/ML IM SUSP: 150.0000 mg | INTRAMUSCULAR | 2 refills | Status: DC

## 2022-06-09 MED ORDER — MEDROXYPROGESTERONE ACETATE 150 MG/ML IM SUSP: 150.0000 mg | INTRAMUSCULAR | 0 refills | Status: DC

## 2022-06-09 NOTE — Progress Notes (Signed)
Date last pap: 07/18/2019. Last Depo-Provera: 03/20/2022. Side Effects if any: NA. Serum HCG indicated? NA. Depo-Provera 150 mg IM given by: Delana Meyer, RN into LEFT deltoid, tolerated well.  Next appointment due Nov 16-30.  Rx for Depo sent to pharmacy [facility]

## 2022-08-30 ENCOUNTER — Ambulatory Visit: Payer: Medicaid Other

## 2022-09-05 ENCOUNTER — Ambulatory Visit: Payer: Medicaid Other

## 2023-01-20 ENCOUNTER — Ambulatory Visit: Payer: Medicaid Other | Admitting: Obstetrics

## 2023-01-31 ENCOUNTER — Ambulatory Visit: Payer: Medicaid Other

## 2023-05-22 ENCOUNTER — Ambulatory Visit: Payer: Medicaid Other | Admitting: Emergency Medicine

## 2023-05-22 VITALS — BP 117/81 | HR 76 | Wt 134.0 lb

## 2023-05-22 DIAGNOSIS — Z3042 Encounter for surveillance of injectable contraceptive: Secondary | ICD-10-CM

## 2023-05-22 LAB — POCT URINE PREGNANCY: Preg Test, Ur: NEGATIVE

## 2023-05-22 MED ORDER — MEDROXYPROGESTERONE ACETATE 150 MG/ML IM SUSP: 150.0000 mg | INTRAMUSCULAR | 0 refills | Status: DC

## 2023-06-16 ENCOUNTER — Ambulatory Visit (INDEPENDENT_AMBULATORY_CARE_PROVIDER_SITE_OTHER): Payer: Medicaid Other | Admitting: General Practice

## 2023-06-16 VITALS — BP 113/69 | HR 68 | Ht 65.0 in | Wt 131.9 lb

## 2023-06-16 DIAGNOSIS — Z30013 Encounter for initial prescription of injectable contraceptive: Secondary | ICD-10-CM | POA: Diagnosis not present

## 2023-06-16 MED ORDER — MEDROXYPROGESTERONE ACETATE 150 MG/ML IM SUSP
150.0000 mg | Freq: Once | INTRAMUSCULAR | Status: AC
  Administered 2023-06-16: 150 mg via INTRAMUSCULAR

## 2023-06-16 MED ORDER — MEDROXYPROGESTERONE ACETATE 150 MG/ML IM SUSP: 150.0000 mg | INTRAMUSCULAR | 0 refills | Status: DC

## 2023-06-16 NOTE — Progress Notes (Signed)
Date last pap: 07-18-19. Last Depo-Provera: 03-20-22. Side Effects if any: N/A pt tolerated well. Serum HCG indicated? Negative UPT in office on 05-22-23. Depo-Provera 150 mg IM given by: Hope Pigeon, CMA in the LD per pt request. Next appointment due 11/22-12/6.   Pt advised to schedule AEX and next Depo injection.

## 2023-06-18 NOTE — Progress Notes (Signed)
Patient was assessed and managed by nursing staff during this encounter. I have reviewed the chart and agree with the documentation and plan. I have also made any necessary editorial changes.  Warden Fillers, MD 06/18/2023 7:52 PM

## 2023-09-15 ENCOUNTER — Ambulatory Visit: Payer: Medicaid Other

## 2023-09-18 ENCOUNTER — Ambulatory Visit (INDEPENDENT_AMBULATORY_CARE_PROVIDER_SITE_OTHER): Payer: Medicaid Other

## 2023-09-18 VITALS — Wt 134.0 lb

## 2023-09-18 DIAGNOSIS — Z3042 Encounter for surveillance of injectable contraceptive: Secondary | ICD-10-CM | POA: Diagnosis not present

## 2023-09-18 MED ORDER — MEDROXYPROGESTERONE ACETATE 150 MG/ML IM SUSP
150.0000 mg | Freq: Once | INTRAMUSCULAR | Status: AC
  Administered 2023-09-18: 150 mg via INTRAMUSCULAR

## 2023-09-18 NOTE — Progress Notes (Signed)
Date last pap: 07/2019. Last Depo-Provera: 06/16/23. Side Effects if any: none. Serum HCG indicated? UPT in office today is . Depo-Provera 150 mg IM given by: S. Malen Gauze, CMA. Next appointment due 2/24-3/10/25. Pt made aware she will need AEX prior to next Depo appt.  Administrations This Visit     medroxyPROGESTERone (DEPO-PROVERA) injection 150 mg     Admin Date 09/18/2023 Action Given Dose 150 mg Route Intramuscular Documented By Lanney Gins, CMA

## 2023-10-26 ENCOUNTER — Ambulatory Visit: Payer: Medicaid Other | Admitting: Obstetrics and Gynecology

## 2023-12-04 ENCOUNTER — Ambulatory Visit: Payer: Medicaid Other

## 2023-12-26 ENCOUNTER — Ambulatory Visit

## 2023-12-28 ENCOUNTER — Ambulatory Visit (INDEPENDENT_AMBULATORY_CARE_PROVIDER_SITE_OTHER)

## 2023-12-28 VITALS — BP 114/74 | HR 70

## 2023-12-28 DIAGNOSIS — Z3202 Encounter for pregnancy test, result negative: Secondary | ICD-10-CM | POA: Diagnosis not present

## 2023-12-28 DIAGNOSIS — Z3042 Encounter for surveillance of injectable contraceptive: Secondary | ICD-10-CM

## 2023-12-28 LAB — POCT URINE PREGNANCY: Preg Test, Ur: NEGATIVE

## 2023-12-28 MED ORDER — MEDROXYPROGESTERONE ACETATE 150 MG/ML IM SUSP
150.0000 mg | Freq: Once | INTRAMUSCULAR | Status: AC
  Administered 2023-12-28: 150 mg via INTRAMUSCULAR

## 2023-12-28 NOTE — Progress Notes (Signed)
 Date last pap: Pt due for AEX, aware that needs before next depo due to no refills on depo. Last Depo-Provera: 09/18/23. Pt outside of depo window. Per protocol if pt >14 weeks from depo but has not had unprotected intercourse in two weeks obtain UPT, if negative can give depo. Pt last unprotected intercourse not within past two weeks, UPT obtained, negative, depo given. Informed pt that it will take depo 2 weeks for full affect so abstain from unprotected intercourse during this time. Side Effects if any: NA. Serum HCG indicated? NA. Depo-Provera 150 mg IM given by: Karma Ganja, RN. Next appointment due June 5-19 2025.

## 2024-02-01 ENCOUNTER — Ambulatory Visit: Admitting: Obstetrics and Gynecology

## 2024-02-20 ENCOUNTER — Other Ambulatory Visit (HOSPITAL_COMMUNITY)
Admission: RE | Admit: 2024-02-20 | Discharge: 2024-02-20 | Disposition: A | Discharge status: Home / Self Care | Source: Ambulatory Visit | Attending: Obstetrics and Gynecology | Admitting: Obstetrics and Gynecology

## 2024-02-20 ENCOUNTER — Ambulatory Visit (INDEPENDENT_AMBULATORY_CARE_PROVIDER_SITE_OTHER): Admitting: Obstetrics and Gynecology

## 2024-02-20 ENCOUNTER — Encounter: Payer: Self-pay | Admitting: Obstetrics and Gynecology

## 2024-02-20 VITALS — BP 100/68 | HR 75 | Ht 64.0 in | Wt 146.4 lb

## 2024-02-20 DIAGNOSIS — R300 Dysuria: Secondary | ICD-10-CM | POA: Diagnosis not present

## 2024-02-20 DIAGNOSIS — R1084 Generalized abdominal pain: Secondary | ICD-10-CM

## 2024-02-20 DIAGNOSIS — N390 Urinary tract infection, site not specified: Secondary | ICD-10-CM

## 2024-02-20 LAB — POCT URINALYSIS DIPSTICK
Blood, UA: NEGATIVE
Glucose, UA: NEGATIVE
Ketones, UA: NEGATIVE
Nitrite, UA: NEGATIVE
Protein, UA: POSITIVE — AB
Spec Grav, UA: 1.025 (ref 1.010–1.025)
Urobilinogen, UA: 2 U/dL — AB
pH, UA: 6 (ref 5.0–8.0)

## 2024-02-20 NOTE — Progress Notes (Signed)
 OSD ~1 week. Lower abdominal pain, burning inside abdomen. Urine has odor, describes the smell as blood. Reports urine is a darker color. Haven't had period since depo ~ 2 years. No itching, discharge, or other symptoms.

## 2024-02-20 NOTE — Progress Notes (Signed)
 GYNECOLOGY VISIT  Patient name: Erin Torres MRN [id number]  Date of birth: May 03, 1991 Chief Complaint:   Urinary Tract Infection  History:  Erin Torres is a 33 y.o. [id number] being seen today for concern for UTI.    Discussed the use of AI scribe software for clinical note transcription with the patient, who gave verbal consent to proceed.  History of Present Illness Erin Torres is a 33 year old female who presents with symptoms concerning for a urinary tract infection (UTI).  She has been experiencing lower abdominal pain and dysuria for the past week. There is a noticeable smell similar to blood after urination, despite not menstruating due to the Depo shot.  She has had similar episodes in the past, which resolved without medical intervention. She attributes these episodes to inadequate hydration, as her urine is often dark. She is sexually active, but infrequently, approximately every three months.  She notes that consuming spaghetti exacerbates her symptoms, suggesting a possible sensitivity to acidic foods. She does not consume alcohol or soda but drinks tea. Her diet includes a significant amount of oatmeal, which she believes provides sufficient fiber.  No fever, chills, nausea, vomiting, changes in bowel habits, or vaginal discharge.    Past Medical History:  Diagnosis Date   Anemia    Medical history non-contributory    No pertinent past medical history     Past Surgical History:  Procedure Laterality Date   NO PAST SURGERIES      The following portions of the patient's history were reviewed and updated as appropriate: allergies, current medications, past family history, past medical history, past social history, past surgical history and problem list.   Health Maintenance:   Last pap     Component Value Date/Time   DIAGPAP  07/18/2019 0942    - Negative for intraepithelial lesion or malignancy (NILM)   DIAGPAP Molecular only (A)  07/18/2019 0942   ADEQPAP  07/18/2019 0942    Satisfactory for evaluation; transformation zone component PRESENT.   ADEQPAP Molecular only 07/18/2019 0942    High Risk HPV: Positive  Adequacy:  Satisfactory for evaluation, transformation zone component PRESENT  Diagnosis:  Atypical squamous cells of undetermined significance (ASC-US )  Last mammogram: n/a   Review of Systems:  Pertinent items are noted in HPI. Comprehensive review of systems was otherwise negative.   Objective:  Physical Exam BP 100/68   Pulse 75   Ht 5\' 4"  (1.626 m)   Wt 146 lb 6.4 oz (66.4 kg)   BMI 25.13 kg/m    Physical Exam Vitals and nursing note reviewed.  Constitutional:      Appearance: Normal appearance.  HENT:     Head: Normocephalic and atraumatic.  Pulmonary:     Effort: Pulmonary effort is normal.  Skin:    General: Skin is warm and dry.  Neurological:     General: No focal deficit present.     Mental Status: She is alert.  Psychiatric:        Mood and Affect: Mood normal.        Behavior: Behavior normal.        Thought Content: Thought content normal.        Judgment: Judgment normal.       Assessment & Plan:  Assessment and Plan Assessment & Plan Dysuria and abdominal pain Symptoms suggestive of UTI include lower abdominal pain, dysuria, and hematuria-like odor in urine. Differential diagnosis includes bladder pain syndrome and GERD. Possible bladder pain  syndrome due to abdominal sensitivity and potential dietary triggers. Bladder pain syndrome, can be managed by avoiding food triggers. GERD considered due to upper abdominal sensitivity and exacerbation of symptoms after consuming acidic foods. Symptoms may overlap with bladder pain syndrome. - Order urinalysis and culture to check for bacteriuria. - Perform self-swab to rule out yeast infection or other causes. - Advise keeping a diary of symptoms and dietary intake to identify potential triggers.   Routine preventative  health maintenance measures emphasized.  Kiki Pelton, MD [facility], [facility]

## 2024-02-20 NOTE — Patient Instructions (Signed)
 We will see what your urine and vaginal swabs show. You may have GERD (reflux) or interstitial cystitis (bladder pain syndrome) both of which can be worsened by food. We will see what your urine test shows.  Keep a diary of what your eating and if you are having pain and where the pain is located.  Recommend adequate hydration

## 2024-02-22 ENCOUNTER — Ambulatory Visit: Admitting: Obstetrics & Gynecology

## 2024-02-22 ENCOUNTER — Other Ambulatory Visit: Payer: Self-pay | Admitting: Obstetrics and Gynecology

## 2024-02-22 ENCOUNTER — Ambulatory Visit: Payer: Self-pay | Admitting: Obstetrics and Gynecology

## 2024-02-22 DIAGNOSIS — R8290 Unspecified abnormal findings in urine: Secondary | ICD-10-CM

## 2024-02-22 LAB — URINE CULTURE: Organism ID, Bacteria: NO GROWTH

## 2024-02-23 ENCOUNTER — Other Ambulatory Visit

## 2024-02-23 DIAGNOSIS — R8290 Unspecified abnormal findings in urine: Secondary | ICD-10-CM

## 2024-02-23 LAB — CERVICOVAGINAL ANCILLARY ONLY
Bacterial Vaginitis (gardnerella): NEGATIVE
Candida Glabrata: NEGATIVE
Candida Vaginitis: NEGATIVE
Chlamydia: NEGATIVE
Comment: NEGATIVE
Comment: NEGATIVE
Comment: NEGATIVE
Comment: NEGATIVE
Comment: NEGATIVE
Comment: NORMAL
Neisseria Gonorrhea: NEGATIVE
Trichomonas: NEGATIVE

## 2024-02-24 LAB — COMPREHENSIVE METABOLIC PANEL WITH GFR
ALT: 12 IU/L (ref 0–32)
AST: 14 IU/L (ref 0–40)
Albumin: 4.5 g/dL (ref 3.9–4.9)
Alkaline Phosphatase: 81 IU/L (ref 44–121)
BUN/Creatinine Ratio: 13 (ref 9–23)
BUN: 12 mg/dL (ref 6–20)
Bilirubin Total: 0.3 mg/dL (ref 0.0–1.2)
CO2: 21 mmol/L (ref 20–29)
Calcium: 9.6 mg/dL (ref 8.7–10.2)
Chloride: 104 mmol/L (ref 96–106)
Creatinine, Ser: 0.9 mg/dL (ref 0.57–1.00)
Globulin, Total: 2.9 g/dL (ref 1.5–4.5)
Glucose: 117 mg/dL — ABNORMAL HIGH (ref 70–99)
Potassium: 4.1 mmol/L (ref 3.5–5.2)
Sodium: 141 mmol/L (ref 134–144)
Total Protein: 7.4 g/dL (ref 6.0–8.5)
eGFR: 86 mL/min/{1.73_m2} (ref >59)

## 2024-02-26 ENCOUNTER — Ambulatory Visit: Payer: Self-pay | Admitting: Obstetrics and Gynecology

## 2024-03-21 ENCOUNTER — Ambulatory Visit

## 2024-03-26 ENCOUNTER — Ambulatory Visit

## 2024-03-27 ENCOUNTER — Ambulatory Visit

## 2024-03-31 ENCOUNTER — Other Ambulatory Visit: Payer: Self-pay | Admitting: Obstetrics & Gynecology

## 2024-04-01 ENCOUNTER — Ambulatory Visit

## 2024-04-01 ENCOUNTER — Other Ambulatory Visit: Payer: Self-pay

## 2024-04-01 DIAGNOSIS — Z3042 Encounter for surveillance of injectable contraceptive: Secondary | ICD-10-CM

## 2024-04-01 MED ORDER — MEDROXYPROGESTERONE ACETATE 150 MG/ML IM SUSP: 150.0000 mg | INTRAMUSCULAR | 0 refills | Status: AC

## 2024-04-02 ENCOUNTER — Ambulatory Visit

## 2024-04-02 DIAGNOSIS — Z3042 Encounter for surveillance of injectable contraceptive: Secondary | ICD-10-CM

## 2024-04-02 MED ORDER — MEDROXYPROGESTERONE ACETATE 150 MG/ML IM SUSP
150.0000 mg | Freq: Once | INTRAMUSCULAR | Status: AC
  Administered 2024-04-02: 150 mg via INTRAMUSCULAR

## 2024-04-02 NOTE — Progress Notes (Signed)
 Date last pap: AEX on 02/20/24. Last Depo-Provera : 12/28/23. Pt 5 days outside of window. No unprotected intercourse in past two weeks, per protocol okay to give dose. Side Effects if any: NA. Serum HCG indicated? NA. Depo-Provera  150 mg IM given by: Duwaine Galla, RN. Next appointment due September 9 - 23 2025.

## 2024-04-26 ENCOUNTER — Encounter (HOSPITAL_COMMUNITY): Payer: Self-pay

## 2024-04-26 ENCOUNTER — Ambulatory Visit (HOSPITAL_COMMUNITY)
Admission: EM | Admit: 2024-04-26 | Discharge: 2024-04-26 | Disposition: A | Discharge status: Home / Self Care | Attending: Emergency Medicine | Admitting: Emergency Medicine

## 2024-04-26 DIAGNOSIS — L301 Dyshidrosis [pompholyx]: Secondary | ICD-10-CM | POA: Diagnosis not present

## 2024-04-26 MED ORDER — TRIAMCINOLONE ACETONIDE 0.5 % EX OINT
1.0000 | TOPICAL_OINTMENT | Freq: Two times a day (BID) | CUTANEOUS | 0 refills | Status: AC
Start: 2024-04-26 — End: ?

## 2024-04-26 NOTE — ED Provider Notes (Signed)
 [facility]    CSN: [id number] Arrival date & time: 04/26/24  9082      History   Chief Complaint Chief Complaint  Patient presents with   Rash    HPI Erin Torres is a 33 y.o. female.   Patient presents with concerns for chronic eczema to her hands.  Patient states that she has been applying Vaseline, hydrocortisone cream, and 0.1% triamcinolone  cream without relief.  Patient denies ever seeing a dermatologist for this issue.  The history is provided by the patient and medical records.  Rash   Past Medical History:  Diagnosis Date   Anemia    Medical history non-contributory    No pertinent past medical history     Patient Active Problem List   Diagnosis Date Noted   Thrombocytosis 07/19/2019   Hx of migraines 12/17/2014    Past Surgical History:  Procedure Laterality Date   NO PAST SURGERIES      OB History     Gravida  6   Para  5   Term  5   Preterm      AB  1   Living  5      SAB  1   IAB      Ectopic      Multiple  0   Live Births  5            Home Medications    Prior to Admission medications   Medication Sig Start Date End Date Taking? Authorizing Provider  triamcinolone  ointment (KENALOG ) 0.5 % Apply 1 Application topically 2 (two) times daily. 04/26/24  Yes Johnie Flaming A, NP  Blood Pressure Monitoring (BLOOD PRESSURE KIT) DEVI 1 kit by Does not apply route once a week. Patient not taking: Reported on 12/28/2023 09/20/21   Constant, Peggy, MD  ibuprofen  (ADVIL ) 600 MG tablet Take 1 tablet (600 mg total) by mouth every 6 (six) hours. Patient not taking: Reported on 12/28/2023 03/20/22   Gene Lucie BROCKS, CNM  Iron  Polysacch Cmplx-B12-FA 150-0.025-1 MG CAPS Take 1 capsule by mouth every other day. Patient not taking: Reported on 12/28/2023 03/01/22   Rudy Carlin LABOR, MD  medroxyPROGESTERone  (DEPO-PROVERA ) 150 MG/ML injection Inject into the muscle. 12/25/23   [provider]   medroxyPROGESTERone  (DEPO-PROVERA ) 150 MG/ML injection Inject 1 mL (150 mg total) into the muscle every 3 (three) months. 04/01/24   Constant, Peggy, MD  Prenat-FeCbn-FeAsp-Meth-FA-DHA (PRENATE MINI ) 18-0.6-0.4-350 MG CAPS Take 1 capsule by mouth daily. Patient not taking: Reported on 12/28/2023 08/31/21   Eveline Lynwood MATSU, MD    Family History Family History  Problem Relation Age of Onset   Hypertension Maternal Grandmother    Hypertension Paternal Grandmother    Diabetes Paternal Grandmother    Anesthesia problems Neg Hx     Social History Social History   Tobacco Use   Smoking status: Never   Smokeless tobacco: Never  Vaping Use   Vaping status: Never Used  Substance Use Topics   Alcohol use: No    Alcohol/week: 0.0 standard drinks of alcohol   Drug use: No     Allergies   Patient has no known allergies.   Review of Systems Review of Systems  Skin:  Positive for rash.   Per HPI  Physical Exam Triage Vital Signs ED Triage Vitals  Encounter Vitals Group     BP 04/26/24 0959 129/73     Girls Systolic BP Percentile --      Girls Diastolic BP  Percentile --      Boys Systolic BP Percentile --      Boys Diastolic BP Percentile --      Pulse Rate 04/26/24 0958 68     Resp 04/26/24 0958 14     Temp 04/26/24 0958 98.7 F (37.1 C)     Temp Source 04/26/24 0958 Oral     SpO2 04/26/24 0958 98 %     Weight --      Height --      Head Circumference --      Peak Flow --      Pain Score 04/26/24 0958 3     Pain Loc --      Pain Education --      Exclude from Growth Chart --    No data found.  Updated Vital Signs BP 129/73 (BP Location: Right Arm)   Pulse 68   Temp 98.7 F (37.1 C) (Oral)   Resp 14   SpO2 98%   Visual Acuity Right Eye Distance:   Left Eye Distance:   Bilateral Distance:    Right Eye Near:   Left Eye Near:    Bilateral Near:     Physical Exam Vitals and nursing note reviewed.  Constitutional:      General: She is awake. She is not  in acute distress.    Appearance: Normal appearance. She is well-developed and well-groomed. She is not ill-appearing.  Skin:    General: Skin is warm and dry.     Findings: Erythema and rash present. Rash is scaling.     Comments: Erythematous scaling and blistery rash noted to bilateral hands  Neurological:     Mental Status: She is alert.  Psychiatric:        Behavior: Behavior is cooperative.      UC Treatments / Results  Labs (all labs ordered are listed, but only abnormal results are displayed) Labs Reviewed - No data to display  EKG   Radiology No results found.  Procedures Procedures (including critical care time)  Medications Ordered in UC Medications - No data to display  Initial Impression / Assessment and Plan / UC Course  I have reviewed the triage vital signs and the nursing notes.  Pertinent labs & imaging results that were available during my care of the patient were reviewed by me and considered in my medical decision making (see chart for details).     Patient is overall well-appearing.  Vitals are stable.  Exam findings consistent with dyshidrotic eczema.  Prescribed triamcinolone  ointment.  Given dermatology follow-up.  Discussed follow-up and return precautions. Final Clinical Impressions(s) / UC Diagnoses   Final diagnoses:  Dyshidrotic eczema     Discharge Instructions      Apply triamcinolone  ointment twice daily.  You can apply this and then wear the cotton gloves as you have been doing with the lower dose triamcinolone  as well. I have attached Delon Lenis who is a dermatologist that you can follow-up with if this problem continues. Otherwise follow-up with your primary care provider or return here as needed.    ED Prescriptions     Medication Sig Dispense Auth. Provider   triamcinolone  ointment (KENALOG ) 0.5 % Apply 1 Application topically 2 (two) times daily. 30 g Johnie Flaming A, NP      PDMP not reviewed this  encounter.   Johnie Flaming A, NP 04/26/24 1058

## 2024-04-26 NOTE — Discharge Instructions (Addendum)
 Apply triamcinolone  ointment twice daily.  You can apply this and then wear the cotton gloves as you have been doing with the lower dose triamcinolone  as well. I have attached Delon Lenis who is a dermatologist that you can follow-up with if this problem continues. Otherwise follow-up with your primary care provider or return here as needed.

## 2024-04-26 NOTE — ED Triage Notes (Signed)
 Patient has had a rash to both hands, left shoulder and states she has had since taking the depo shot. Patient reports a history of eczema. Patient states the eczema on the right hand has become for tender.  Patient states she has been using vaseline and hydrocortisone cream, cotton gloves, etc.

## 2024-06-26 ENCOUNTER — Ambulatory Visit

## 2024-07-01 ENCOUNTER — Ambulatory Visit: Admitting: Obstetrics

## 2024-07-09 ENCOUNTER — Ambulatory Visit: Admitting: Obstetrics
